# Patient Record
Sex: Female | Born: 2000 | Race: White | Hispanic: No | Marital: Single | Smoking: Never smoker
Health system: Southern US, Community
[De-identification: ages and names within clinical notes are randomized; demographics above are authoritative.]

## PROBLEM LIST (undated history)

## (undated) DIAGNOSIS — J45909 Unspecified asthma, uncomplicated: Secondary | ICD-10-CM

## (undated) DIAGNOSIS — F909 Attention-deficit hyperactivity disorder, unspecified type: Secondary | ICD-10-CM

## (undated) DIAGNOSIS — M109 Gout, unspecified: Secondary | ICD-10-CM

## (undated) HISTORY — DX: Unspecified asthma, uncomplicated: J45.909

## (undated) HISTORY — DX: Attention-deficit hyperactivity disorder, unspecified type: F90.9

---
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
## 2021-01-05 ENCOUNTER — Other Ambulatory Visit: Payer: Self-pay

## 2021-01-05 ENCOUNTER — Other Ambulatory Visit (INDEPENDENT_AMBULATORY_CARE_PROVIDER_SITE_OTHER): Payer: Medicaid Other | Admitting: Family Medicine

## 2021-01-05 ENCOUNTER — Encounter: Payer: Self-pay | Admitting: Family Medicine

## 2021-01-05 VITALS — BP 118/66 | HR 71 | Temp 98.0°F | Ht 64.26 in | Wt 252.0 lb

## 2021-01-05 DIAGNOSIS — H6502 Acute serous otitis media, left ear: Secondary | ICD-10-CM

## 2021-01-05 DIAGNOSIS — Z3042 Encounter for surveillance of injectable contraceptive: Secondary | ICD-10-CM | POA: Diagnosis not present

## 2021-01-05 MED ORDER — CLARITIN-D 12 HOUR 5-120 MG PO TB12: 1.0000 | ORAL_TABLET | Freq: Two times a day (BID) | ORAL | 1 refills | Status: DC

## 2021-01-05 MED ORDER — FLUTICASONE PROPIONATE 50 MCG/ACT NA SUSP: 2.0000 | Freq: Every day | NASAL | 1 refills | Status: DC

## 2021-01-05 MED ORDER — MEDROXYPROGESTERONE ACETATE 150 MG/ML IM SUSP
150.0000 mg | Freq: Once | INTRAMUSCULAR | Status: AC
  Administered 2021-01-05: 150 mg via INTRAMUSCULAR

## 2021-01-05 NOTE — Addendum Note (Signed)
Addended by: Erven Colla on: 01/05/2021 11:09 AM   Modules accepted: Orders, Level of Service

## 2021-01-05 NOTE — Progress Notes (Signed)
   Patient ID: Crystal Holloway, female    DOB: 08-08-2001, 20 y.o.   MRN: [id number]   Chief Complaint  Patient presents with  . left ear pain    X 3 days currently taking cough syrup per UC for about a week    Subjective:    HPI   Medical History Luevenia has a past medical history of ADHD (attention deficit hyperactivity disorder) and Asthma.   Outpatient Encounter Medications as of 01/05/2021  Medication Sig  . amoxicillin-clavulanate (AUGMENTIN) 875-125 MG tablet Take 1 tablet by mouth 2 (two) times daily.  . hydrocortisone 1 % ointment Apply 1 application topically 2 (two) times daily.  . montelukast (SINGULAIR) 10 MG tablet Take 1 tablet (10 mg total) by mouth at bedtime.  . [EXPIRED] medroxyPROGESTERone (DEPO-PROVERA) injection 150 mg    No facility-administered encounter medications on file as of 01/05/2021.     Review of Systems   Vitals There were no vitals taken for this visit.  Objective:   Physical Exam   Assessment and Plan   1. Surveillance for Depo-Provera contraception - medroxyPROGESTERone (DEPO-PROVERA) injection 150 mg      No follow-ups on file.   01/05/2021

## 2021-01-05 NOTE — Progress Notes (Addendum)
   Patient ID: Crystal Holloway, female    DOB: 2001/07/23, 20 y.o.   MRN: [id number]   Chief Complaint  Patient presents with  . left ear pain    X 3 days currently taking cough syrup per UC for about a week    Subjective:    HPI Pt having left ear pain.  1 wk ago went to urgent care for uri, dx with bronchitis and allergies.  Ear pain started 3 days ago. meds- gave a pill for cough and inhaler.   Treated 2/22- augmentin. For concern of dog bite.   Medical History Earnie has a past medical history of ADHD (attention deficit hyperactivity disorder) and Asthma.   Outpatient Encounter Medications as of 01/05/2021  Medication Sig  . amoxicillin-clavulanate (AUGMENTIN) 875-125 MG tablet Take 1 tablet by mouth 2 (two) times daily.  . hydrocortisone 1 % ointment Apply 1 application topically 2 (two) times daily.  . montelukast (SINGULAIR) 10 MG tablet Take 1 tablet (10 mg total) by mouth at bedtime.  . [EXPIRED] medroxyPROGESTERone (DEPO-PROVERA) injection 150 mg    No facility-administered encounter medications on file as of 01/05/2021.     Review of Systems  +Left ear pain,  Denies fever, chills, n/v/d.  Denies sore throat, sob, wheezing, eye symptoms. +mild coughing.     Vitals There were no vitals taken for this visit.  Objective:   Physical Exam  Left TM- mild erythema, with clear serous effusion.  Rt tm - normal.  Normal EAC bilaterally. Nose- clear drainage bilaterally. resp- non labored breathing. Eyes- normal conjunctive, no drainage.  Assessment and Plan   1. Surveillance for Depo-Provera contraception - medroxyPROGESTERone (DEPO-PROVERA) injection 150 mg   1. Non-recurrent acute serous otitis media of left ear  2. Surveillance for Depo-Provera contraception - medroxyPROGESTERone (DEPO-PROVERA) injection 150 mg  Gave flonase and claritin d for 10-14 days. Tylenol or ibuprofen prn for pain. Warm compress to ear/neck. If not better call or rto.      F/u prn.   01/05/2021

## 2021-01-05 NOTE — Patient Instructions (Signed)
Otitis Media With Effusion, Pediatric  Otitis media with effusion (OME) occurs when there is inflammation of the middle ear and fluid in the middle ear space. The middle ear space contains air and the bones for hearing. Air in the middle ear space helps to transmit sound to the brain. OME is a common condition in children, and it can occur after an ear infection. This condition may be present for several weeks or longer after an ear infection. Most cases of this condition get better on their own. What are the causes? OME is caused by a blockage of the eustachian tube in one or both ears. These tubes drain fluid in the ears to the back of the nose (nasopharynx). If the tissue in the tube swells up (edema), the tube closes. This prevents fluid from draining. Blockage can be caused by:  Ear infections.  Colds and other upper respiratory infections.  Enlarged adenoids. The adenoids are areas of soft tissue located high in the back of the throat, behind the nose and the roof of the mouth. They are part of the body's natural defense (immune) system.  A mass in the back of the nose (nasopharynx).  Damage to the ear caused by pressure changes (barotrauma). What increases the risk? Your child is more likely to develop this condition if he or she:  Has repeated ear and sinus infections.  Has allergies.  Is exposed to tobacco smoke.  Attends day care.  Was not breastfed. What are the signs or symptoms? Symptoms of this condition may not be obvious. Sometimes this condition does not have any symptoms, or symptoms may overlap with those of a cold or upper respiratory tract illness. Symptoms of this condition include:  Temporary hearing loss.  A feeling of fullness in the ear without pain.  Irritability or agitation.  Balance (vestibular) problems. As a result of hearing loss, your child may:  Listen to the TV at a loud volume.  Not respond to questions.  Ask "What?" often when spoken  to.  Mistake or confuse one sound or word for another.  Perform poorly at school.  Have a poor attention span.  Become agitated or irritated easily. How is this diagnosed? This condition is diagnosed with an ear exam. Your child's health care provider will look inside your child's ear with an instrument (otoscope) to check for redness, swelling, and fluid. Other tests may be done, including:  A test to check the movement of the eardrum (pneumatic otoscopy). This is done by squeezing a small amount of air into the ear.  A test that changes air pressure in the middle ear to check how well the eardrum moves and to see if the eustachian tube is working (tympanogram).  Hearing test (audiogram). This test involves playing tones at different pitches to see if your child can hear each tone.   How is this treated? Treatment for this condition depends on the cause. In many cases, the fluid goes away on its own. In some cases, your child may need a procedure to create a hole in the eardrum to allow fluid to drain (myringotomy) and to insert small drainage tubes (tympanostomy tubes) into the eardrums. These tubes help to drain fluid and prevent infection. This procedure may be recommended if:  OME does not get better over several months.  Your child has many ear infections within several months.  Your child has noticeable hearing loss.  Your child has problems with speech and language development. Surgery may also be   done to remove the adenoids (adenoidectomy) if it seems they are contributing to the condition. Follow these instructions at home:  Give over-the-counter and prescription medicines only as told by your child's health care provider.  Keep children away from any tobacco smoke.  Keep all follow-up visits as told by your child's health care provider. This is important. How is this prevented?  Keep your child's vaccinations up to date.  Encourage hand washing. Your child should  wash his or her hands often with soap and water. If there is no soap and water, he or she should use hand sanitizer.  Avoid exposing your child to tobacco smoke.  Give your baby breastmilk, if possible. Breastfed babies are less likely to develop this condition. Contact a health care provider if:  Your child's hearing does not get better after 3 months.  Your child's hearing is worse.  Your child has ear pain.  Your child has a fever.  Your child has drainage from the ear.  Your child is dizzy.  Your child has a lump on his or her neck. Get help right away if your child:  Has bleeding from the nose.  Cannot move part of his or her face.  Has trouble breathing.  Cannot smell.  Develops severe congestion.  Develops weakness.  Who is younger than 3 months has a temperature of 100.4F (38C) or higher. Summary  Otitis media with effusion (OME) occurs when there is inflammation of the middle ear and fluid in the middle ear space. This can occur following an ear infection.  Symptoms may include hearing loss, a feeling of fullness in the ear, increased irritability, and possible balance issues. Sometimes there are no symptoms.  This condition can be diagnosed with a physical exam and some additional testing.  Treatment depends on the cause. Observation may be recommended. This information is not intended to replace advice given to you by your health care provider. Make sure you discuss any questions you have with your health care provider. Document Revised: 07/30/2019 Document Reviewed: 07/30/2019 Elsevier Patient Education  2021 [employer] Inc.  

## 2021-01-10 ENCOUNTER — Ambulatory Visit: Payer: Medicaid Other | Admitting: Family Medicine

## 2021-01-18 ENCOUNTER — Ambulatory Visit: Payer: Medicaid Other | Admitting: Family Medicine

## 2021-02-08 ENCOUNTER — Other Ambulatory Visit: Payer: Self-pay

## 2021-02-08 ENCOUNTER — Ambulatory Visit (INDEPENDENT_AMBULATORY_CARE_PROVIDER_SITE_OTHER): Payer: Medicaid Other | Admitting: Family Medicine

## 2021-02-08 VITALS — BP 119/87 | HR 93 | Temp 98.1°F | Ht 64.25 in | Wt 248.2 lb

## 2021-02-08 DIAGNOSIS — R1032 Left lower quadrant pain: Secondary | ICD-10-CM

## 2021-02-08 NOTE — Progress Notes (Signed)
Patient ID: Crystal Holloway, female    DOB: [birth year], 20 y.o.   MRN: [id number]   Chief Complaint  Patient presents with   lower abdominal cramps    Since last pm   Subjective:    HPI Feeling like period cramps and feeling it's more on left side.  Mother and sister with ovarian cysts.  Not had periods recently and is on depo provera.   Was on bcp for 2-3 months.   Went back on depo to avoid having periods. Was 8/21- restarted the depo provera injection.  Cramping started last night. Not near her regular menstrual time.  Diarrhea- chronic happenings daily.  Pt thinking it was IBS. Hasn't seen GI doctors.  Took Midol today, didn't help.  No recent sexual intercourse.  No concerns of stds. No dysuria, hematuria, vaginal discharge, or pelvic pain. No h/o stds.  39moago ended a long 3 yr relationship/engagement. Dec eating and feeling nausea after eating, lost 10 lbs.   Medical History KAnastasiyahas a past medical history of ADHD (attention deficit hyperactivity disorder) and Asthma.   Outpatient Encounter Medications as of 02/08/2021  Medication Sig   amoxicillin-clavulanate (AUGMENTIN) 875-125 MG tablet Take 1 tablet by mouth 2 (two) times daily. (Patient not taking: Reported on 02/08/2021)   fluticasone (FLONASE) 50 MCG/ACT nasal spray Place 2 sprays into both nostrils daily.   hydrocortisone 1 % ointment Apply 1 application topically 2 (two) times daily.   loratadine-pseudoephedrine (CLARITIN-D 12 HOUR) 5-120 MG tablet Take 1 tablet by mouth 2 (two) times daily.   montelukast (SINGULAIR) 10 MG tablet Take 1 tablet (10 mg total) by mouth at bedtime.   No facility-administered encounter medications on file as of 02/08/2021.     Review of Systems  Constitutional: Negative for chills and fever.  HENT: Negative for congestion, rhinorrhea and sore throat.   Respiratory: Negative for cough, shortness of breath and wheezing.   Cardiovascular: Negative for chest pain and  leg swelling.  Gastrointestinal: Positive for abdominal pain (left lower quad). Negative for diarrhea, nausea and vomiting.  Genitourinary: Negative for difficulty urinating, dysuria, frequency, hematuria, menstrual problem, pelvic pain, vaginal bleeding, vaginal discharge and vaginal pain.  Musculoskeletal: Negative for arthralgias and back pain.  Skin: Negative for rash.  Neurological: Negative for dizziness, weakness and headaches.     Vitals BP 119/87   Pulse 93   Temp 98.1 F (36.7 C) (Oral)   Ht 5' 4.25" (1.632 m)   Wt 248 lb 3.2 oz (112.6 kg)   SpO2 99%   BMI 42.27 kg/m   Objective:   Physical Exam Vitals and nursing note reviewed.  Constitutional:      General: She is not in acute distress.    Appearance: Normal appearance. She is not ill-appearing.  HENT:     Head: Normocephalic and atraumatic.  Eyes:     Extraocular Movements: Extraocular movements intact.     Conjunctiva/sclera: Conjunctivae normal.     Pupils: Pupils are equal, round, and reactive to light.  Cardiovascular:     Rate and Rhythm: Normal rate and regular rhythm.     Pulses: Normal pulses.     Heart sounds: Normal heart sounds.  Pulmonary:     Effort: Pulmonary effort is normal.     Breath sounds: Normal breath sounds. No wheezing, rhonchi or rales.  Abdominal:     General: Bowel sounds are normal. There is no distension.     Palpations: Abdomen is soft. There is no mass.  Tenderness: There is abdominal tenderness (left lower quad). There is no guarding or rebound.     Hernia: No hernia is present.  Musculoskeletal:        General: Normal range of motion.     Right lower leg: No edema.     Left lower leg: No edema.  Skin:    General: Skin is warm and dry.     Findings: No lesion or rash.  Neurological:     General: No focal deficit present.     Mental Status: She is alert and oriented to person, place, and time.  Psychiatric:        Mood and Affect: Mood normal.        Behavior:  Behavior normal.     Assessment and Plan   1. Left lower quadrant pain - CBC With Differential - CMP14+EGFR - Lipase - US Pelvic Complete With Transvaginal; Future - US Pelvic Complete With Transvaginal    Pt having left lower quad pain, intermittent. Take midol or tyelnol prn.  If having severe pain needing to go to ER for urgent consult.  Labs and u/s to be done stat.  Will call pt with results.   Return if symptoms worsen or fail to improve.

## 2021-02-09 ENCOUNTER — Ambulatory Visit (HOSPITAL_COMMUNITY)
Admission: RE | Admit: 2021-02-09 | Discharge: 2021-02-09 | Disposition: A | Discharge status: Home / Self Care | Payer: Medicaid Other | Source: Ambulatory Visit | Attending: Family Medicine | Admitting: Family Medicine

## 2021-02-09 ENCOUNTER — Other Ambulatory Visit (HOSPITAL_COMMUNITY)
Admission: RE | Admit: 2021-02-09 | Discharge: 2021-02-09 | Disposition: A | Discharge status: Home / Self Care | Payer: Medicaid Other | Source: Ambulatory Visit | Attending: Family Medicine | Admitting: Family Medicine

## 2021-02-09 DIAGNOSIS — R1032 Left lower quadrant pain: Secondary | ICD-10-CM | POA: Diagnosis not present

## 2021-02-09 LAB — COMPREHENSIVE METABOLIC PANEL
ALT: 34 U/L (ref 0–44)
AST: 23 U/L (ref 15–41)
Albumin: 4.2 g/dL (ref 3.5–5.0)
Alkaline Phosphatase: 89 U/L (ref 38–126)
Anion gap: 6 (ref 5–15)
BUN: 7 mg/dL (ref 6–20)
CO2: 23 mmol/L (ref 22–32)
Calcium: 9 mg/dL (ref 8.9–10.3)
Chloride: 107 mmol/L (ref 98–111)
Creatinine, Ser: 0.58 mg/dL (ref 0.44–1.00)
GFR, Estimated: >60 mL/min (ref >60)
Glucose, Bld: 92 mg/dL (ref 70–99)
Potassium: 4 mmol/L (ref 3.5–5.1)
Sodium: 136 mmol/L (ref 135–145)
Total Bilirubin: 0.7 mg/dL (ref 0.3–1.2)
Total Protein: 7.5 g/dL (ref 6.5–8.1)

## 2021-02-09 LAB — CBC WITH DIFFERENTIAL/PLATELET
Abs Immature Granulocytes: 0.04 10*3/uL (ref 0.00–0.07)
Basophils Absolute: 0 10*3/uL (ref 0.0–0.1)
Basophils Relative: 1 %
Eosinophils Absolute: 0.1 10*3/uL (ref 0.0–0.5)
Eosinophils Relative: 2 %
HCT: 42.6 % (ref 36.0–46.0)
Hemoglobin: 14.4 g/dL (ref 12.0–15.0)
Immature Granulocytes: 1 %
Lymphocytes Relative: 32 %
Lymphs Abs: 2.9 10*3/uL (ref 0.7–4.0)
MCH: 30.3 pg (ref 26.0–34.0)
MCHC: 33.8 g/dL (ref 30.0–36.0)
MCV: 89.7 fL (ref 80.0–100.0)
Monocytes Absolute: 0.5 10*3/uL (ref 0.1–1.0)
Monocytes Relative: 6 %
Neutro Abs: 5.3 10*3/uL (ref 1.7–7.7)
Neutrophils Relative %: 58 %
Platelets: 456 10*3/uL — ABNORMAL HIGH (ref 150–400)
RBC: 4.75 MIL/uL (ref 3.87–5.11)
RDW: 12 % (ref 11.5–15.5)
WBC: 8.9 10*3/uL (ref 4.0–10.5)
nRBC: 0 % (ref 0.0–0.2)

## 2021-02-09 LAB — LIPASE, BLOOD: Lipase: 27 U/L (ref 11–51)

## 2021-03-29 ENCOUNTER — Other Ambulatory Visit: Payer: Medicaid Other

## 2021-08-30 ENCOUNTER — Other Ambulatory Visit: Payer: Self-pay

## 2021-08-30 ENCOUNTER — Encounter: Payer: Self-pay | Admitting: Nurse Practitioner

## 2021-08-30 ENCOUNTER — Ambulatory Visit (INDEPENDENT_AMBULATORY_CARE_PROVIDER_SITE_OTHER): Payer: Medicaid Other | Admitting: Nurse Practitioner

## 2021-08-30 VITALS — BP 128/84 | HR 80 | Temp 97.9°F | Ht 64.27 in | Wt 241.6 lb

## 2021-08-30 DIAGNOSIS — Z30013 Encounter for initial prescription of injectable contraceptive: Secondary | ICD-10-CM

## 2021-08-30 DIAGNOSIS — F419 Anxiety disorder, unspecified: Secondary | ICD-10-CM

## 2021-08-30 DIAGNOSIS — F32A Depression, unspecified: Secondary | ICD-10-CM

## 2021-08-30 MED ORDER — MEDROXYPROGESTERONE ACETATE 150 MG/ML IM SUSP: 150.0000 mg | Freq: Once | INTRAMUSCULAR | 0 refills | Status: DC

## 2021-08-30 MED ORDER — SERTRALINE HCL 25 MG PO TABS: 25.0000 mg | ORAL_TABLET | Freq: Every day | ORAL | 0 refills | Status: DC

## 2021-08-30 MED ORDER — FLUOXETINE HCL 10 MG PO CAPS
10.0000 mg | ORAL_CAPSULE | Freq: Every day | ORAL | 0 refills | Status: DC
Start: 2021-08-30 — End: 2021-09-12

## 2021-08-30 NOTE — Progress Notes (Signed)
° °  Subjective:    Patient ID: Crystal Holloway, female    DOB: 08-27-2001, 20 y.o.   MRN: [id number]  HPI  Patient reports to clinic with desire to restart depo. Patient's last depo shot was June/July 2022. Patient started back having her periods in November and her menstrual cycle has been on and off since then. Patient described flow as spotting. Denies any heavy flows. Patient states period stopped about 4 days ago. Patient states that she has had both STI testing and a pregnancy test done around 11/28 which both resulted as negative. Patient took home pregnancy test this week negative. Patient states she missed not having periods.  Patient also would like to discuss depression. Patient states that her fiance committed suicide about 6 months ago and she has felt very sad. She mentions that she tends to keep her emotions to herself however, it is getting hard. She has since reunited with her family after leaving them for her fiance which she states is very hard. Patient mentions that she needs help with her emotions. Patient denies wanting to hurt herself or anyone else.     Review of Systems  Constitutional:  Negative for chills and fever.  Psychiatric/Behavioral:  Positive for agitation, dysphoric mood and sleep disturbance. Negative for behavioral problems, confusion, decreased concentration and suicidal ideas.       Objective:   Physical Exam Constitutional:      General: She is not in acute distress.    Appearance: Normal appearance. She is obese. She is not ill-appearing or toxic-appearing.  HENT:     Head: Normocephalic.  Cardiovascular:     Rate and Rhythm: Normal rate and regular rhythm.     Pulses: Normal pulses.     Heart sounds: Normal heart sounds. No murmur heard. Pulmonary:     Effort: Pulmonary effort is normal. No respiratory distress.     Breath sounds: No wheezing.  Musculoskeletal:        General: Normal range of motion.  Skin:    General: Skin is warm.   Neurological:     General: No focal deficit present.     Mental Status: She is alert and oriented to person, place, and time.  Psychiatric:        Mood and Affect: Mood normal.        Behavior: Behavior normal.     Comments: Tearful at times          Assessment & Plan:  1. Anxiety and depression - Start Prozac 10mg  daily. RTC in 2 weeks and will increase to 20mg  if tolerating well. - Side effect profile reviewed.  - Offered counseling, patient states that she had done counseling in the past but it has not helped. - Highly encouraged patient listen to audiobook, Feeling Good by Dr. Judie Petit. - Discussed benefits of CBT  - Discussed benefit of both CBT and pharmacologic therapy simultaneously - PHQ-9 = 12 today. - Patient denies thoughts of hurting self or anyone else.   2. Encounter for initial prescription of injectable contraceptive - Depo-Provera sent to pharmacy for patient pick up and bring to clinic in 2 weeks. - Conduct UPT prior to administering contraceptive

## 2021-08-30 NOTE — Patient Instructions (Signed)
° °  High suggest to read this book and we can discuss possible options for counseling in the future

## 2021-09-12 ENCOUNTER — Encounter: Payer: Self-pay | Admitting: Nurse Practitioner

## 2021-09-12 ENCOUNTER — Other Ambulatory Visit: Payer: Self-pay

## 2021-09-12 ENCOUNTER — Ambulatory Visit (INDEPENDENT_AMBULATORY_CARE_PROVIDER_SITE_OTHER): Payer: Medicaid Other | Admitting: Nurse Practitioner

## 2021-09-12 VITALS — BP 122/78 | HR 91 | Ht 64.0 in | Wt 241.0 lb

## 2021-09-12 DIAGNOSIS — Z30013 Encounter for initial prescription of injectable contraceptive: Secondary | ICD-10-CM

## 2021-09-12 DIAGNOSIS — F419 Anxiety disorder, unspecified: Secondary | ICD-10-CM

## 2021-09-12 DIAGNOSIS — F32A Depression, unspecified: Secondary | ICD-10-CM

## 2021-09-12 DIAGNOSIS — Z0000 Encounter for general adult medical examination without abnormal findings: Secondary | ICD-10-CM

## 2021-09-12 DIAGNOSIS — Z0001 Encounter for general adult medical examination with abnormal findings: Secondary | ICD-10-CM | POA: Diagnosis not present

## 2021-09-12 LAB — POCT URINE PREGNANCY: Preg Test, Ur: NEGATIVE

## 2021-09-12 MED ORDER — FLUOXETINE HCL 20 MG PO CAPS: 20.0000 mg | ORAL_CAPSULE | Freq: Every day | ORAL | 0 refills | Status: DC

## 2021-09-12 MED ORDER — FLUOXETINE HCL 20 MG PO CAPS
20.0000 mg | ORAL_CAPSULE | Freq: Every day | ORAL | 0 refills | Status: DC
Start: 2021-09-12 — End: 2022-05-30

## 2021-09-12 MED ORDER — MEDROXYPROGESTERONE ACETATE 150 MG/ML IM SUSP
150.0000 mg | Freq: Once | INTRAMUSCULAR | Status: AC
  Administered 2021-09-12: 150 mg via INTRAMUSCULAR

## 2021-09-12 NOTE — Progress Notes (Signed)
° °  Subjective:    Patient ID: Crystal Holloway, female    DOB: 2000-10-11, 21 y.o.   MRN: [id number]  HPI  The patient comes in today for a wellness visit.  A review of their health history was completed.  A review of medications was also completed.  Any needed refills: Depo Provera: Would like to restart depo provera. LMP 08/19/21 for 7 days. No intercourse since then. Pregnancy test negative today.   Eating habits: trying to eat better- no appetite lately. Self reports hx of IBS that has been better since she has started Prozac.   Falls/  MVA accidents in past few months: none  Regular exercise: just at work  Specialist pt sees on regular basis: no  Preventative health issues were discussed.  Due for flu vax Due for COVID vax HPV vax  Additional concerns:  Anxiety/Depression Patient states that she has been doing well on Prozac 10mg . Patient states that her mood has improved greatly since being on the medication but she does notice that she begins to get sad around 5pm. However, patient states she is much happier now. Patient also states that her IBS symptoms have lessened since being on Prozac. Patient has not had a chance to read Dr. Eilleen Kempf "Feeling Good" but has started reading "Unfu*k Yourself" by Virl Diamond which has helped a lot.   Intentional weight loss Patient states that she has lost 10-15lbs since November and can now wear a size 14. She states she is really proud of herself and is motivated to continue to loose more weight.   Results for orders placed or performed in visit on 09/12/21  POCT urine pregnancy  Result Value Ref Range   Preg Test, Ur Negative Negative       Review of Systems All symptoms negative    Objective:   Physical Exam Constitutional:      Appearance: Normal appearance. She is obese.  Neurological:     Mental Status: She is alert.          Assessment & Plan:   1. Encounter for initial prescription of injectable  contraceptive - Depo given today. - POCT urine pregnancy= negative today - RTC in 3 months of next depo shot  2. Wellness examination - Adolescent wellness-complete.wellness physical was conducted today. Importance of diet and exercise were discussed in detail.  In addition to this a discussion regarding safety was also covered. We also reviewed over immunizations and gave recommendations regarding current immunization needed for age.  In addition to this additional areas were also touched on including: - Preventative health exams needed:  - Patient declined COVID vaccine today - Patient declined Flu vaccine today - Discussed HPV vax. Patient unsure if she received it previously or not. Will discuss with mother. If not, will re-address in 3 months.  -Patient was advised yearly wellness exam  - Discussed vaping and dangers of vaping.  - HIV antibody (with reflex) - Hepatitis C Antibody - CBC with Differential - Lipid Panel With LDL/HDL Ratio  3. Anxiety and depression - Patient doing well on Prozac 10mg  and would like to increase to Prozac 20mg . - FLUoxetine (PROZAC) 20 MG capsule; Take 1 capsule (20 mg total) by mouth daily.  Dispense: 90 capsule; Refill: 0 - Continue to search out counseling options - RTC in 3 months - PHQ-9 = 4 today - GAD 7 = 13 today - No thoughts of hurting self or anyone else.

## 2021-09-13 LAB — HIV ANTIBODY (ROUTINE TESTING W REFLEX): HIV Screen 4th Generation wRfx: NONREACTIVE

## 2021-09-13 LAB — CBC WITH DIFFERENTIAL/PLATELET
Basophils Absolute: 0.1 10*3/uL (ref 0.0–0.2)
Basos: 1 %
EOS (ABSOLUTE): 0.1 10*3/uL (ref 0.0–0.4)
Eos: 1 %
Hematocrit: 44.6 % (ref 34.0–46.6)
Hemoglobin: 15.2 g/dL (ref 11.1–15.9)
Immature Grans (Abs): 0.1 10*3/uL (ref 0.0–0.1)
Immature Granulocytes: 1 %
Lymphocytes Absolute: 3.2 10*3/uL — ABNORMAL HIGH (ref 0.7–3.1)
Lymphs: 29 %
MCH: 31.5 pg (ref 26.6–33.0)
MCHC: 34.1 g/dL (ref 31.5–35.7)
MCV: 92 fL (ref 79–97)
Monocytes Absolute: 0.6 10*3/uL (ref 0.1–0.9)
Monocytes: 6 %
Neutrophils Absolute: 6.8 10*3/uL (ref 1.4–7.0)
Neutrophils: 62 %
Platelets: 471 10*3/uL — ABNORMAL HIGH (ref 150–450)
RBC: 4.83 x10E6/uL (ref 3.77–5.28)
RDW: 13.1 % (ref 11.7–15.4)
WBC: 10.8 10*3/uL (ref 3.4–10.8)

## 2021-09-13 LAB — LIPID PANEL WITH LDL/HDL RATIO
Cholesterol, Total: 154 mg/dL (ref 100–169)
HDL: 41 mg/dL (ref >39)
LDL Chol Calc (NIH): 83 mg/dL (ref 0–109)
LDL/HDL Ratio: 2 ratio (ref 0.0–3.2)
Triglycerides: 172 mg/dL — ABNORMAL HIGH (ref 0–89)
VLDL Cholesterol Cal: 30 mg/dL (ref 5–40)

## 2021-09-13 LAB — HEPATITIS C ANTIBODY: Hep C Virus Ab: <0.1 s/co ratio (ref 0.0–0.9)

## 2021-09-14 NOTE — Progress Notes (Signed)
Please call the patient with the following message:  Your labs looked good. HIV negative Blood count looks good Cholesterol looks good with execption of triglycerides. It is moderately elevated. You can change that by diet and exercise. Eat a well balanced meal with plenty vegetables and fiber. Exercise for ~30 minutes a day for 3-5x a week.   I hope all is well. Let us know if you need anything.

## 2021-11-29 ENCOUNTER — Encounter: Payer: Self-pay | Admitting: Nurse Practitioner

## 2021-11-29 ENCOUNTER — Ambulatory Visit: Payer: Medicaid Other | Admitting: Nurse Practitioner

## 2022-05-30 ENCOUNTER — Ambulatory Visit (INDEPENDENT_AMBULATORY_CARE_PROVIDER_SITE_OTHER): Payer: Medicaid Other | Admitting: Family Medicine

## 2022-05-30 DIAGNOSIS — L989 Disorder of the skin and subcutaneous tissue, unspecified: Secondary | ICD-10-CM

## 2022-05-30 MED ORDER — IMIQUIMOD 5 % EX CREA: TOPICAL_CREAM | Freq: Every day | CUTANEOUS | 0 refills | Status: DC

## 2022-05-30 NOTE — Patient Instructions (Signed)
Medication as prescribed.  If you continue to have difficulty, please let me know.

## 2022-05-30 NOTE — Progress Notes (Signed)
Subjective:  Patient ID: Crystal Holloway, female    DOB: [birth year]  Age: 21 y.o. MRN: [id number]  CC: Chief Complaint  Patient presents with   small gorwth on right hand index finger    She tried cutting some of it off 1 month ago and it grew back 2 days later    HPI:  21 year old female presents for evaluation of the above.   This area has been present for > 6 months per report. Was accidentally "cut off" 1 month ago and has return. It is raised and located on the right index finger. No currently redness or pain. No drainage. No relieving factors.   Patient Active Problem List   Diagnosis Date Noted   Lesion of finger 05/30/2022   Birth control counseling 04/28/2020   Atypical mole 04/28/2020   Irritable bowel syndrome with both constipation and diarrhea 04/08/2019   Acne vulgaris 06/26/2017   Dysmenorrhea 06/26/2017   Anxiety and depression 11/24/2016   Self-mutilation 11/24/2016   Gastroesophageal reflux disease without esophagitis 08/09/2016   ADD (attention deficit disorder) 10/07/2013    Social Hx   Social History   Socioeconomic History   Marital status: Single    Spouse name: Not on file   Number of children: Not on file   Years of education: Not on file   Highest education level: Not on file  Occupational History   Not on file  Tobacco Use   Smoking status: Never   Smokeless tobacco: Never  Substance and Sexual Activity   Alcohol use: No   Drug use: No   Sexual activity: Not on file  Other Topics Concern   Not on file  Social History Narrative   Not on file   Social Determinants of Health   Financial Resource Strain: Not on file  Food Insecurity: Not on file  Transportation Needs: Not on file  Physical Activity: Not on file  Stress: Not on file  Social Connections: Not on file    Review of Systems Per HPI  Objective:  BP 112/76   Pulse 76   Temp 98.2 F (36.8 C)   Ht '5\' 4"'$  (1.626 m)   Wt 254 lb (115.2 kg)   BMI 43.60 kg/m       05/30/2022    9:22 AM 09/12/2021    2:17 PM 08/30/2021    3:30 PM  BP/Weight  Systolic BP 222 979 892  Diastolic BP 76 78 84  Wt. (Lbs) 254 241   BMI 43.6 kg/m2 41.37 kg/m2     Physical Exam Vitals and nursing note reviewed.  Constitutional:      General: She is not in acute distress.    Appearance: Normal appearance.  HENT:     Head: Normocephalic and atraumatic.  Pulmonary:     Effort: Pulmonary effort is normal. No respiratory distress.  Skin:    Comments: Index finger, right hand - small, raised lesion noted. No erythema. Non tender.   Neurological:     Mental Status: She is alert.  Psychiatric:        Mood and Affect: Mood normal.        Behavior: Behavior normal.    Lab Results  Component Value Date   WBC 10.8 09/12/2021   HGB 15.2 09/12/2021   HCT 44.6 09/12/2021   PLT 471 (H) 09/12/2021   GLUCOSE 92 02/09/2021   CHOL 154 09/12/2021   TRIG 172 (H) 09/12/2021   HDL 41 09/12/2021   LDLCALC 83 09/12/2021  ALT 34 02/09/2021   AST 23 02/09/2021   NA 136 02/09/2021   K 4.0 02/09/2021   CL 107 02/09/2021   CREATININE 0.58 02/09/2021   BUN 7 02/09/2021   CO2 23 02/09/2021     Assessment & Plan:   Problem List Items Addressed This Visit       Other   Lesion of finger    Wart vs cystic lesion. Trial of Imiquimod.       Meds ordered this encounter  Medications   imiquimod (ALDARA) 5 % cream    Sig: Apply topically at bedtime. Max use - 12 weeks.    Dispense:  12 each    Refill:  [place]

## 2022-05-30 NOTE — Assessment & Plan Note (Signed)
Wart vs cystic lesion. Trial of Imiquimod.

## 2022-09-17 ENCOUNTER — Telehealth: Payer: Self-pay | Admitting: *Deleted

## 2022-09-17 NOTE — Telephone Encounter (Signed)
Transition Care Management Follow-up Telephone Call Date of discharge and from where: 09/16/2022 [facility] ER How have you been since you were released from the hospital? Not doing any better  Any questions or concerns? No  Items Reviewed: Did the pt receive and understand the discharge instructions provided? Yes  Medications obtained and verified? Yes  Other? No  Any new allergies since your discharge? No  Dietary orders reviewed? Yes Do you have support at home? Yes   Functional Questionnaire: (I = Independent and D = Dependent) ADLs: i  Bathing/Dressing- i  Meal Prep- i  Eating- i  Maintaining continence- i  Transferring/Ambulation- i  Managing Meds- i  Follow up appointments reviewed:  PCP Hospital f/u appt confirmed?  Will call the PCP office    Are transportation arrangements needed? No  If their condition worsens, is the pt aware to call PCP or go to the Emergency Dept.? Yes Was the patient provided with contact information for the PCP's office or ED? Yes Was to pt encouraged to call back with questions or concerns? Yes

## 2022-09-18 ENCOUNTER — Encounter: Payer: Self-pay | Admitting: Family Medicine

## 2022-09-18 ENCOUNTER — Ambulatory Visit: Payer: Medicaid Other | Admitting: Family Medicine

## 2022-09-18 VITALS — BP 101/68 | Wt 264.0 lb

## 2022-09-18 DIAGNOSIS — S39012D Strain of muscle, fascia and tendon of lower back, subsequent encounter: Secondary | ICD-10-CM | POA: Diagnosis not present

## 2022-09-18 NOTE — Progress Notes (Signed)
   Subjective:    Patient ID: Crystal Holloway, female    DOB: 03/31/2001, 22 y.o.   MRN: [id number]  HPI Pt arrives for ER follow up. Pt went to [facility] ER on 09/16/22 for back strain. ER diagnosed with torn muscle. 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 Pain in mid to lower back. Taking Naproxen and Flexeril   Patient was seen in the ER she relates that this occurred after lifting a client and felt a pop and a pull in her back has had increased pain discomfort ever since Review of Systems     Objective:   Physical Exam  Subjective tenderness in the lower back negative straight leg raise increased pain with rotation      Assessment & Plan:  Back pain Muscle strain Patient requests Velcro brace Avoid lifting over 20 pounds over the course of the next week Then should be able to lift 25 to 30 pounds and after that get back to normal Recommend physical therapy Referral to physical therapy in [facility] Follow-up within 4 to 6 weeks

## 2022-10-09 ENCOUNTER — Telehealth: Payer: Self-pay

## 2022-10-09 NOTE — Telephone Encounter (Signed)
Transition Care Management Unsuccessful Follow-up Telephone Call  Date of discharge and from where:  [facility] 10/08/2022  Attempts:  1st Attempt  Reason for unsuccessful TCM follow-up call:  Left voice message Juanda Crumble, [place] Direct Dial [phone]

## 2022-10-10 NOTE — Telephone Encounter (Signed)
Transition Care Management Follow-up Telephone Call Date of discharge and from where: [facility] ED 10/09/2022 How have you been since you were released from the hospital? Ears still stopped up Any questions or concerns? No  Items Reviewed: Did the pt receive and understand the discharge instructions provided? Yes  Medications obtained and verified? Yes  Other? No  Any new allergies since your discharge? No  Dietary orders reviewed? Yes Do you have support at home? Yes   Home Care and Equipment/Supplies: Were home health services ordered? no If so, what is the name of the agency? N/a  Has the agency set up a time to come to the patient's home? no Were any new equipment or medical supplies ordered?  No What is the name of the medical supply agency? N/a Were you able to get the supplies/equipment? no Do you have any questions related to the use of the equipment or supplies? No  Functional Questionnaire: (I = Independent and D = Dependent) ADLs: I  Bathing/Dressing- I  Meal Prep- I  Eating- I  Maintaining continence- I  Transferring/Ambulation- I  Managing Meds- I  Follow up appointments reviewed:  PCP Hospital f/u appt confirmed? Yes  Scheduled to see Dr Wolfgang Phoenix on 10/12/2022 @ 11:40. [facility] f/u appt confirmed? No   Are transportation arrangements needed? No  If their condition worsens, is the pt aware to call PCP or go to the Emergency Dept.? Yes Was the patient provided with contact information for the PCP's office or ED? Yes Was to pt encouraged to call back with questions or concerns? Yes .lh

## 2022-10-12 ENCOUNTER — Encounter: Payer: Self-pay | Admitting: Family Medicine

## 2022-10-12 ENCOUNTER — Ambulatory Visit: Payer: Medicaid Other | Admitting: Family Medicine

## 2022-10-12 VITALS — BP 122/78 | Wt 258.6 lb

## 2022-10-12 DIAGNOSIS — H65111 Acute and subacute allergic otitis media (mucoid) (sanguinous) (serous), right ear: Secondary | ICD-10-CM | POA: Diagnosis not present

## 2022-10-12 MED ORDER — AMOXICILLIN 500 MG PO CAPS: 500.0000 mg | ORAL_CAPSULE | Freq: Three times a day (TID) | ORAL | 0 refills | Status: AC

## 2022-10-12 NOTE — Progress Notes (Unsigned)
   Subjective:    Patient ID: Crystal Holloway, female    DOB: 06-20-2001, 22 y.o.   MRN: [id number]  HPI Pt arrives to follow up on double ear infection. Pt went to Urgent Care on 10/07/22. Pt states she is still having pressure in right ear. Pt reports ears feel clogged.  Relates slight head congestion drainage ear pressure pain discomfort denies any other trouble  Review of Systems     Objective:   Physical Exam Both ears appear to be recovering from an infection but the right one still has redness with some fluid but not angry looking left ear drum actually looking improved HEENT exam otherwise benign lungs clear       Assessment & Plan:  Persistent otitis 1 additional week of antibiotics call back if progressive troubles or worse Augmentin causing stomach distress therefore switch to amoxicillin notify us if any problems follow-up if not doing better within 7 to 10 days

## 2022-10-30 ENCOUNTER — Encounter: Payer: Self-pay | Admitting: Family Medicine

## 2022-10-30 ENCOUNTER — Ambulatory Visit: Payer: Medicaid Other | Admitting: Family Medicine

## 2022-10-30 VITALS — BP 100/70 | Wt 262.6 lb

## 2022-10-30 DIAGNOSIS — M2550 Pain in unspecified joint: Secondary | ICD-10-CM

## 2022-10-30 DIAGNOSIS — R400 Somnolence: Secondary | ICD-10-CM | POA: Diagnosis not present

## 2022-10-30 NOTE — Progress Notes (Signed)
   Subjective:    Patient ID: Crystal Holloway, female    DOB: [birth year], 22 y.o.   MRN: [id number]  HPI Pt arrives for follow up on back pain. Pt states pain has greatly improved. Can feel radiating pain in back if she is up and then sits down. Patient relates she is going to the gym or doing exercise and her back is starting to feel better She relates she has a lot of hand stiffness shoulder stiffness hip stiffness knee stiffness is worse in the morning hours.  She has a strong family history of rheumatoid arthritis she is concerned about the possibility of arthritis Would like to be checked for RA-pt states has joint pain everywhere and father has RA.  Discuss sleep apnea. Pt states she feels tired upon awakening.  She has a lot snoring at nighttime frequent awakening during the night plus also when she gets up in the morning she feels fatigued and tired sleepy throughout the day positive [place] questionnaire  Review of Systems     Objective:   Physical Exam General-in no acute distress Eyes-no discharge Lungs-respiratory rate normal, CTA CV-no murmurs,RRR Extremities skin warm dry no edema Neuro grossly normal Behavior normal, alert        Assessment & Plan:  Back pain is improving she is going to the gym doing some stretches  Arthralgias stiffness has family history of rheumatoid arthritis she states it is worse in the morning she would like to have blood work to check for this  Daytime somnolence-recommend sleep study Patient does snore a lot has a lot of fatigue and tiredness patient was cautioned never to drive a car if feeling sleepy or feeling like she is going to fall asleep while driving  Morbid obesity patient is working hard at [employer] regular activity and trying to lose weight

## 2022-10-30 NOTE — Patient Instructions (Addendum)
[place]

## 2022-10-31 LAB — C-REACTIVE PROTEIN: CRP: 4 mg/L (ref 0–10)

## 2022-10-31 LAB — RHEUMATOID FACTOR: Rheumatoid fact SerPl-aCnc: <10 IU/mL (ref <14.0)

## 2022-10-31 LAB — SEDIMENTATION RATE: Sed Rate: 10 mm/hr (ref 0–32)

## 2022-11-26 ENCOUNTER — Encounter: Payer: Self-pay | Admitting: Neurology

## 2022-11-26 ENCOUNTER — Ambulatory Visit: Payer: Medicaid Other | Admitting: Neurology

## 2022-11-26 VITALS — BP 127/77 | HR 95 | Ht 65.0 in | Wt 262.0 lb

## 2022-11-26 DIAGNOSIS — Z820 Family history of epilepsy and other diseases of the nervous system: Secondary | ICD-10-CM

## 2022-11-26 DIAGNOSIS — R0683 Snoring: Secondary | ICD-10-CM | POA: Diagnosis not present

## 2022-11-26 DIAGNOSIS — R0681 Apnea, not elsewhere classified: Secondary | ICD-10-CM | POA: Diagnosis not present

## 2022-11-26 DIAGNOSIS — G4719 Other hypersomnia: Secondary | ICD-10-CM | POA: Diagnosis not present

## 2022-11-26 DIAGNOSIS — R351 Nocturia: Secondary | ICD-10-CM

## 2022-11-26 DIAGNOSIS — R519 Headache, unspecified: Secondary | ICD-10-CM

## 2022-11-26 NOTE — Patient Instructions (Signed)

## 2022-11-26 NOTE — Progress Notes (Signed)
Subjective:    Patient ID: Crystal Holloway is a 22 y.o. female.  HPI    Star Age, MD, PhD [facility] [address].O. [place],  [address]  Dear Dr. Wolfgang Phoenix,  I saw your patient, [name], upon your kind request in my sleep clinic today for initial consultation of her sleep disorder, in particular, concern for underlying obstructive sleep apnea.  The patient is unaccompanied today.  As you know, Ms. Dockett is a 22 year old female with an underlying medical history of asthma, ADHD, arthralgia, and severe obesity with a BMI of over 40, who reports snoring and excessive daytime somnolence, as well as witnessed apneas per family.  Her sleepiness has become worse over the past few months.  Her Epworth sleepiness score is 19 out of 24, fatigue severity score is 55 out of 63.  I reviewed your office note from 10/30/2022.  She has woken up with a sense of gasping for air.  Her maternal grandmother had sleep apnea and had a CPAP machine.  Patient suspects that her mom also may have it.  She lives with her parents, her sister, brother-in-law and sister's child.  She does have a TV in her bedroom but it is on a sound program such as rain sounds at night.  She goes to bed generally somewhere between 8:30 PM and 11 PM and rise time is between 6 and 9 AM.  She works at a group home.  She has a variable work schedule.  She does not smoke cigarettes but vapes nicotine.  She is working on nicotine cessation.  She is working on weight loss as well.  She is currently not on any ADD or ADHD medication, reports weight gain on a stimulant.  She has nocturia about twice per average night.  She has occasionally woken up with a headache which is often dull and achy, bifrontal, she takes Tylenol as needed for this.  They have 3 dogs in the household, none of them sleep in her bedroom.  Her Past Medical History Is Significant For: Past Medical History:  Diagnosis Date    ADHD (attention deficit hyperactivity disorder)    Asthma     Her Past Surgical History Is Significant For: History reviewed. No pertinent surgical history.  Her Family History Is Significant For: Family History  Problem Relation Age of Onset   Sleep apnea Mother     Her Social History Is Significant For: Social History   Socioeconomic History   Marital status: Single    Spouse name: Not on file   Number of children: Not on file   Years of education: Not on file   Highest education level: Not on file  Occupational History   Not on file  Tobacco Use   Smoking status: Never   Smokeless tobacco: Never  Vaping Use   Vaping Use: Every day  Substance and Sexual Activity   Alcohol use: No   Drug use: No   Sexual activity: Not on file  Other Topics Concern   Not on file  Social History Narrative   Not on file   Social Determinants of Health   Financial Resource Strain: Not on file  Food Insecurity: Not on file  Transportation Needs: Not on file  Physical Activity: Not on file  Stress: Not on file  Social Connections: Not on file    Her Allergies Are:  Allergies  Allergen Reactions   Ibuprofen Hives and Swelling    Pt reports  being able to take aleve without difficulty  :   Her Current Medications Are:  Outpatient Encounter Medications as of 11/26/2022  Medication Sig   ELURYNG 0.12-0.015 MG/24HR vaginal ring Place vaginally.   No facility-administered encounter medications on file as of 11/26/2022.  :   Review of Systems:  Out of a complete 14 point review of systems, all are reviewed and negative with the exception of these symptoms as listed below:   Review of Systems  Neurological:        Pt here for sleep consult  Pt snores,headaches,fatigue  Pt denies hypertension,sleep study ,CPAP machine    ESS:19 FSS:55    Objective:  Neurological Exam  Physical Exam Physical Examination:   Vitals:   11/26/22 1402  BP: 127/77  Pulse: 95    General  Examination: The patient is a very pleasant 22 y.o. female in no acute distress. She appears well-developed and well-nourished and well groomed.   HEENT: Normocephalic, atraumatic, pupils are equal, round and reactive to light, extraocular tracking is good without limitation to gaze excursion or nystagmus noted. Hearing is grossly intact. Face is symmetric with normal facial animation. Speech is clear with no dysarthria noted. There is no hypophonia. There is no lip, neck/head, jaw or voice tremor. Neck is supple with full range of passive and active motion. There are no carotid bruits on auscultation. Oropharynx exam reveals: mild mouth dryness, adequate dental hygiene and moderate airway crowding, due to small airway entry, Mallampati class III, tonsils 1-2+ bilaterally.  Neck circumference 14 7/8 inches.  She has a minimal overbite.  Tongue protrudes centrally and palate elevates symmetrically.  Chest: Clear to auscultation without wheezing, rhonchi or crackles noted.  Heart: S1+S2+0, regular and normal without murmurs, rubs or gallops noted.   Abdomen: Soft, non-tender and non-distended.  Extremities: There is no pitting edema in the distal lower extremities bilaterally.   Skin: Warm and dry without trophic changes noted.   Musculoskeletal: exam reveals no obvious joint deformities.   Neurologically:  Mental status: The patient is awake, alert and oriented in all 4 spheres. Her immediate and remote memory, attention, language skills and fund of knowledge are appropriate. There is no evidence of aphasia, agnosia, apraxia or anomia. Speech is clear with normal prosody and enunciation. Thought process is linear. Mood is normal and affect is normal.  Cranial nerves II - XII are as described above under HEENT exam.  Motor exam: Normal bulk, strength and tone is noted. There is no obvious action or resting tremor.  Fine motor skills and coordination: grossly intact.  Cerebellar testing: No  dysmetria or intention tremor. There is no truncal or gait ataxia.  Sensory exam: intact to light touch in the upper and lower extremities.  Gait, station and balance: She stands easily. No veering to one side is noted. No leaning to one side is noted. Posture is age-appropriate and stance is narrow based. Gait shows normal stride length and normal pace. No problems turning are noted.   Assessment and Plan:  In summary, Rana Hochstein is a very pleasant 22 y.o.-year old female with an underlying medical history of asthma, ADHD, arthralgia, and severe obesity with a BMI of over 40, whose history and physical exam are concerning for sleep disordered breathing, particularly obstructive sleep apnea (OSA). A laboratory attended sleep study is typically considered "gold standard" for evaluation of sleep disordered breathing.   I had a long chat with the patient about my findings and the diagnosis of sleep apnea,  particularly OSA, its prognosis and treatment options. We talked about medical/conservative treatments, surgical interventions and non-pharmacological approaches for symptom control. I explained, in particular, the risks and ramifications of untreated moderate to severe OSA, especially with respect to developing cardiovascular disease down the road, including congestive heart failure (CHF), difficult to treat hypertension, cardiac arrhythmias (particularly A-fib), neurovascular complications including TIA, stroke and dementia. Even type 2 diabetes has, in part, been linked to untreated OSA. Symptoms of untreated OSA may include (but may not be limited to) daytime sleepiness, nocturia (i.e. frequent nighttime urination), memory problems, mood irritability and suboptimally controlled or worsening mood disorder such as depression and/or anxiety, lack of energy, lack of motivation, physical discomfort, as well as recurrent headaches, especially morning or nocturnal headaches. We talked about the importance of  maintaining a healthy lifestyle and striving for healthy weight.  The importance of complete nicotine cessation was also addressed.  In addition, we talked about the importance of striving for and maintaining good sleep hygiene. I recommended a sleep study at this time. I outlined the differences between a laboratory attended sleep study which is considered more comprehensive and accurate over the option of a home sleep test (HST); the latter may lead to underestimation of sleep disordered breathing in some instances and does not help with diagnosing upper airway resistance syndrome and is not accurate enough to diagnose primary central sleep apnea typically. I outlined possible surgical and non-surgical treatment options of OSA, including the use of a positive airway pressure (PAP) device (i.e. CPAP, AutoPAP/APAP or BiPAP in certain circumstances), a custom-made dental device (aka oral appliance, which would require a referral to a specialist dentist or orthodontist typically, and is generally speaking not considered for patients with full dentures or edentulous state), upper airway surgical options, such as traditional UPPP (which is not considered a first-line treatment) or the Inspire device (hypoglossal nerve stimulator, which would involve a referral for consultation with an ENT surgeon, after careful selection, following inclusion criteria - also not first-line treatment). I explained the PAP treatment option to the patient in detail, as this is generally considered first-line treatment.  The patient indicated that she would be willing to try PAP therapy, if the need arises. I explained the importance of being compliant with PAP treatment, not only for insurance purposes but primarily to improve patient's symptoms symptoms, and for the patient's long term health benefit, including to reduce Her cardiovascular risks longer-term.    We will pick up our discussion about the next steps and treatment options  after testing.  We will keep her posted as to the test results by phone call and/or MyChart messaging where possible.  We will plan to follow-up in sleep clinic accordingly as well.  I answered all her questions today and the patient was in agreement.   I encouraged her to call with any interim questions, concerns, problems or updates or email [place] through [place].  Generally speaking, sleep test authorizations may take up to 2 weeks, sometimes less, sometimes longer, the patient is encouraged to get in touch with [place] if they do not hear back from the sleep lab staff directly within the next 2 weeks.  Thank you very much for allowing me to participate in the care of this nice patient. If I can be of any further assistance to you please do not hesitate to call me at [phone].  Sincerely,   Star Age, MD, PhD

## 2022-12-04 ENCOUNTER — Telehealth: Payer: Self-pay

## 2022-12-04 NOTE — Telephone Encounter (Signed)
Vaccine record printed and up front for pick up 

## 2022-12-04 NOTE — Telephone Encounter (Signed)
Pt need copy of immunization record for new job tomorrow.   [place] [phone]

## 2022-12-10 ENCOUNTER — Telehealth: Payer: Self-pay | Admitting: Neurology

## 2022-12-10 NOTE — Telephone Encounter (Signed)
Sent mychart message

## 2022-12-11 NOTE — Telephone Encounter (Signed)
Can you offer her a HST with payment plan? Please try.

## 2022-12-12 IMAGING — US US PELVIS COMPLETE WITH TRANSVAGINAL
1 series · 14 of 25 positions shown · non-contrast
Comparison: None

CLINICAL DATA: Acute left lower quadrant abdominal pain.

EXAM:
TRANSABDOMINAL AND TRANSVAGINAL ULTRASOUND OF PELVIS
TECHNIQUE: Both transabdominal and transvaginal ultrasound examinations of the
pelvis were performed. Transabdominal technique was performed for
global imaging of the pelvis including uterus, ovaries, adnexal
regions, and pelvic cul-de-sac. It was necessary to proceed with
endovaginal exam following the transabdominal exam to visualize the
endometrium and ovaries.

[Series 1: us pelvic complete with transvaginal · 14 of 92 slices shown]
[im 1/92]
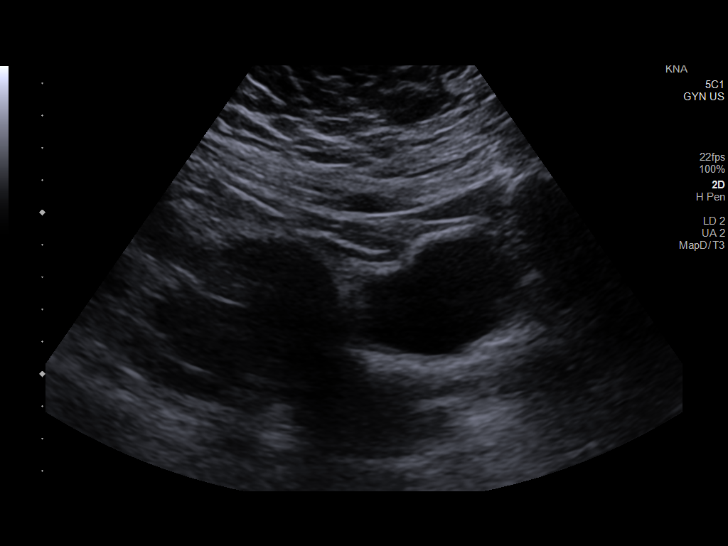
[im 8/92]
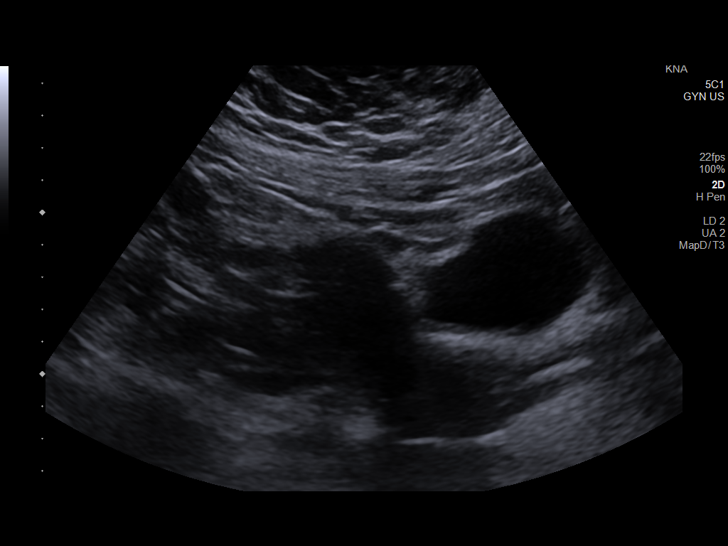
[im 16/92]
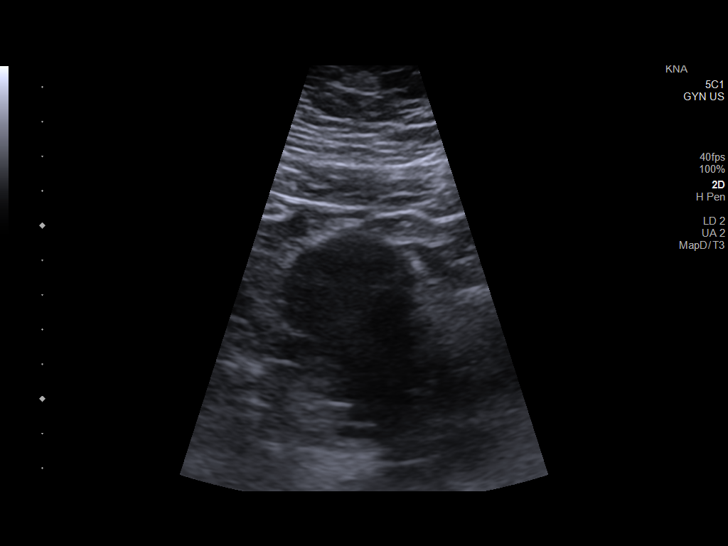
[im 23/92]
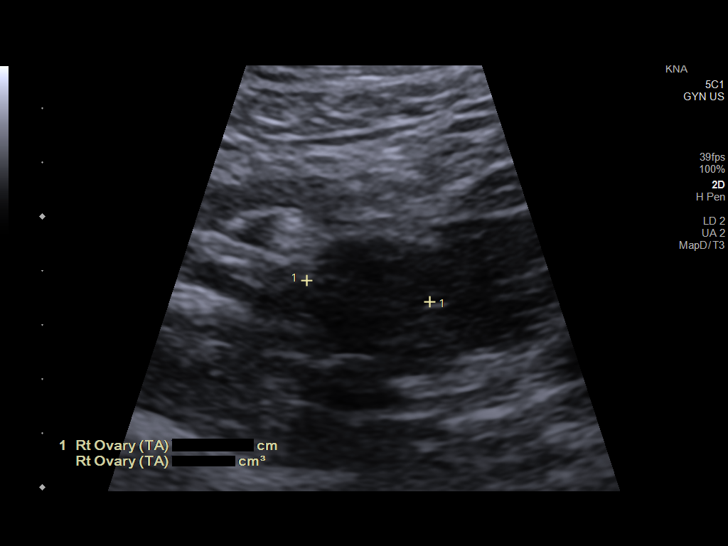
[im 31/92]
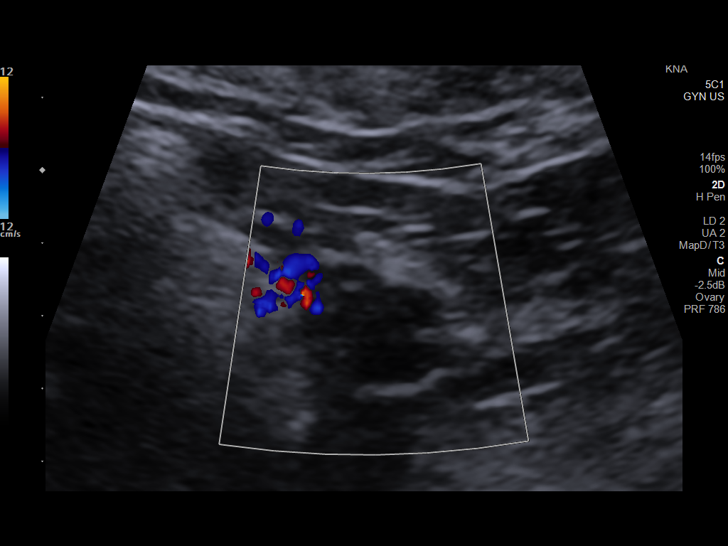
[im 35/92]
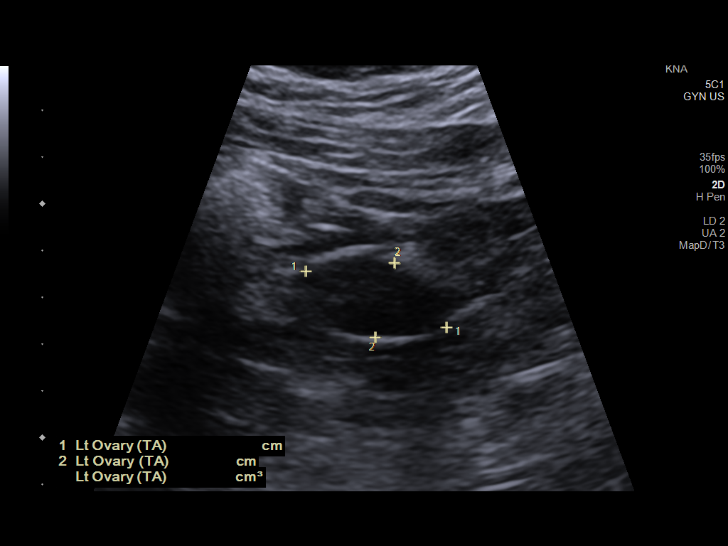
[im 42/92]
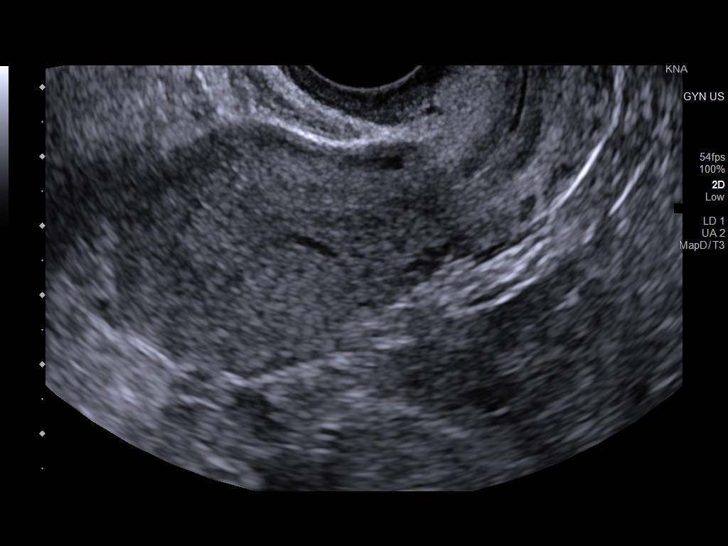
[im 50/92]
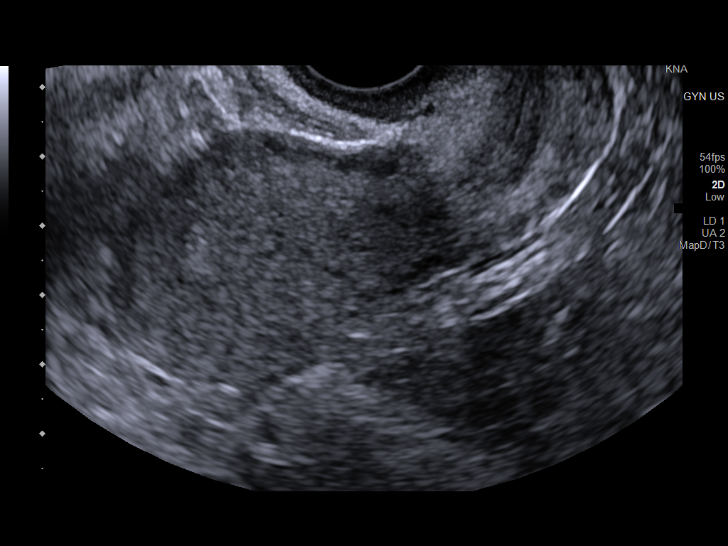
[im 57/92]
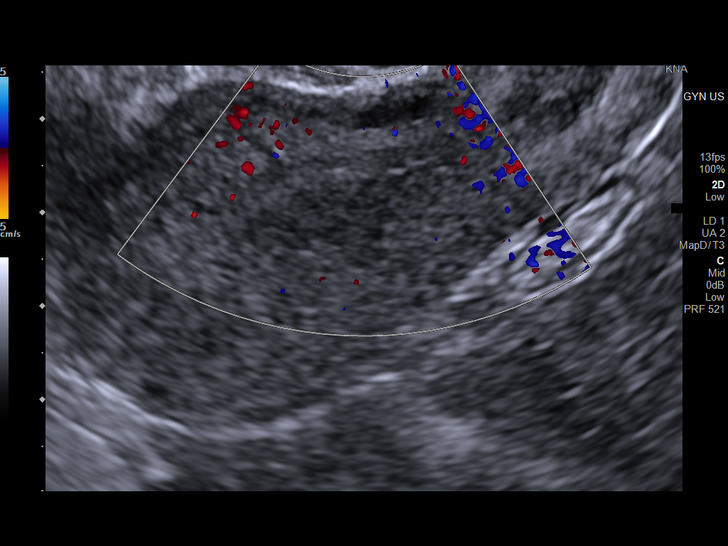
[im 61/92]
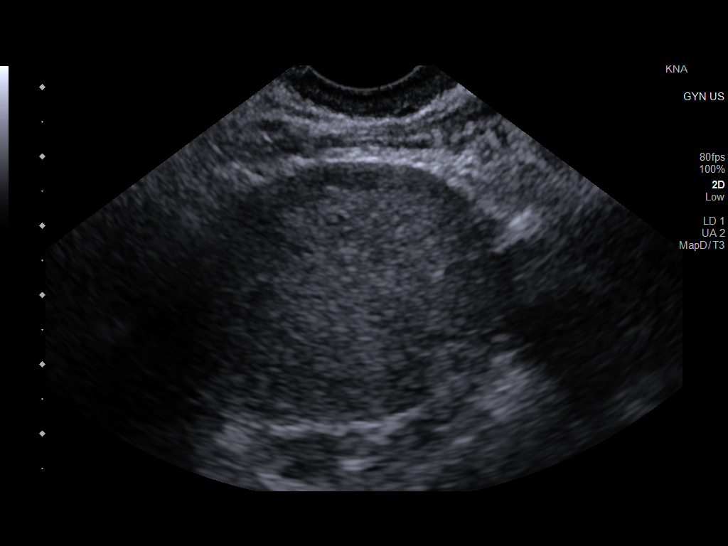
[im 69/92]
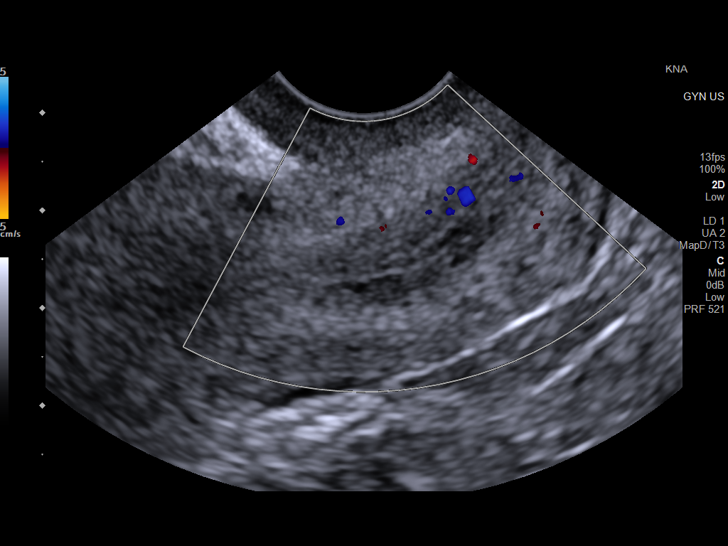
[im 76/92]
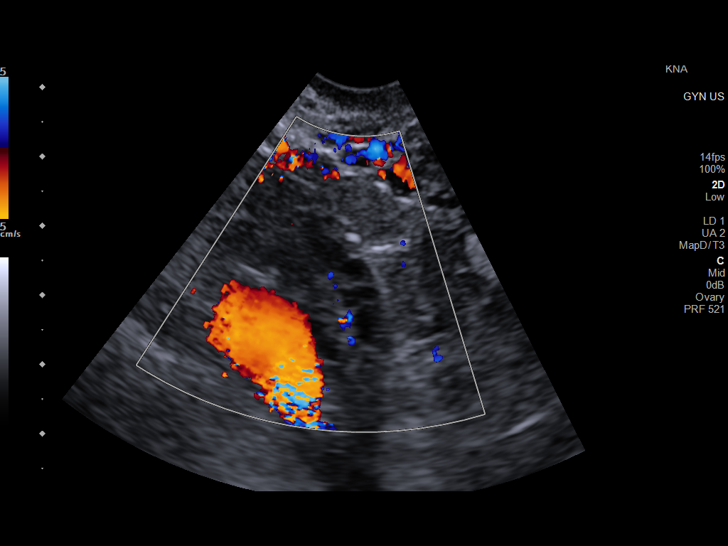
[im 84/92]
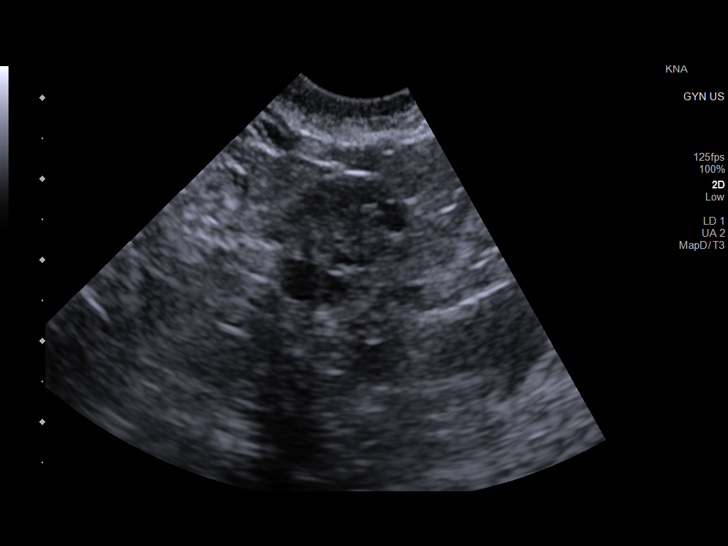
[im 92/92]
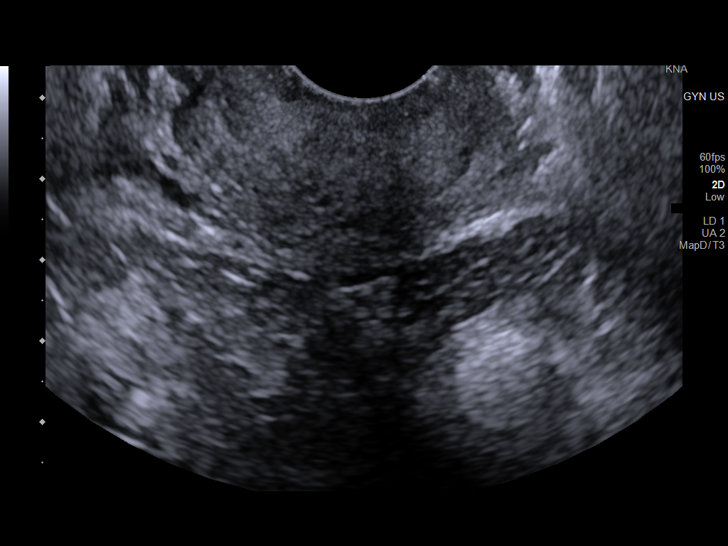

[14 of 25 positions shown; findings below may reference images not displayed]

FINDINGS: Uterus

Measurements: 7.7 x 5.3 x 3.4 cm = volume: 74 mL. No fibroids or
other mass visualized.

Endometrium

Thickness: 9 mm which is within normal limits. No focal abnormality
visualized.

Right ovary

Measurements: 2.9 x 1.5 x 1.4 cm = volume: 3 mL. Normal
appearance/no adnexal mass.

Left ovary

Measurements: 2.8 x 1.9 x 1.7 cm = volume: 5 mL. Normal
appearance/no adnexal mass.

Other findings

No abnormal free fluid.
IMPRESSION: No definite abnormality is noted in the pelvis.

## 2022-12-21 ENCOUNTER — Ambulatory Visit: Payer: Medicaid Other | Admitting: Nurse Practitioner

## 2023-06-20 ENCOUNTER — Ambulatory Visit (INDEPENDENT_AMBULATORY_CARE_PROVIDER_SITE_OTHER): Payer: No Typology Code available for payment source | Admitting: Nurse Practitioner

## 2023-06-20 VITALS — BP 118/89 | HR 98 | Temp 97.5°F | Ht 65.0 in | Wt 272.4 lb

## 2023-06-20 DIAGNOSIS — Z13220 Encounter for screening for lipoid disorders: Secondary | ICD-10-CM

## 2023-06-20 DIAGNOSIS — Z13228 Encounter for screening for other metabolic disorders: Secondary | ICD-10-CM

## 2023-06-20 DIAGNOSIS — Z30013 Encounter for initial prescription of injectable contraceptive: Secondary | ICD-10-CM | POA: Diagnosis not present

## 2023-06-20 DIAGNOSIS — B078 Other viral warts: Secondary | ICD-10-CM | POA: Diagnosis not present

## 2023-06-20 DIAGNOSIS — Z130 Encounter for screening for diseases of the blood and blood-forming organs and certain disorders involving the immune mechanism: Secondary | ICD-10-CM

## 2023-06-20 DIAGNOSIS — Z1329 Encounter for screening for other suspected endocrine disorder: Secondary | ICD-10-CM

## 2023-06-20 LAB — POCT URINE PREGNANCY: Preg Test, Ur: NEGATIVE

## 2023-06-20 MED ORDER — MEDROXYPROGESTERONE ACETATE 150 MG/ML IM SUSP
150.0000 mg | Freq: Once | INTRAMUSCULAR | 0 refills | Status: DC
Start: 2023-06-20 — End: 2023-06-20

## 2023-06-20 MED ORDER — MEDROXYPROGESTERONE ACETATE 150 MG/ML IM SUSY
PREFILLED_SYRINGE | INTRAMUSCULAR | Status: DC
Start: 2023-06-20 — End: 2023-06-20

## 2023-06-20 MED ORDER — MEDROXYPROGESTERONE ACETATE 150 MG/ML IM SUSP
150.0000 mg | Freq: Once | INTRAMUSCULAR | Status: AC
Start: 2023-06-20 — End: 2023-06-20
  Administered 2023-06-20: 150 mg via INTRAMUSCULAR

## 2023-06-20 NOTE — Progress Notes (Signed)
Acute Office Visit  Subjective:     Patient ID: Crystal Holloway, female    DOB: 16-Nov-2000, 22 y.o.   MRN: [id number]    HPI Patient is in today for a request to resume her Depo-provera injections. She was previously receiving Depo-provera injections and switched to NuvaRing. She states it is not comfortable and she stopped using this method two weeks prior. Denies any past issues when taking Depo-Provera. She is not currently sexually active and has not been within the past 12 months. Reports irregular periods, menstruating approximately once every two months. Her periods last for 3 days and are light, with moderate cramping. LMP was in August, pt is unsure of the date. She currently vapes every day, denies alcohol or drug use.   Pt also has a common wart noted on her right index finger. She would like a referral to dermatology. Has tried multiple OTC products with some temporary improvement.   Review of Systems  Constitutional:  Negative for chills and fever.  Respiratory:  Negative for shortness of breath and wheezing.   Cardiovascular:  Negative for chest pain and palpitations.  Gastrointestinal:  Negative for abdominal pain, constipation, diarrhea, heartburn, nausea and vomiting.  Genitourinary:  Negative for dysuria, frequency, hematuria and urgency.        Objective:    BP 118/89   Pulse 98   Temp (!) 97.5 F (36.4 C)   Ht 5\' 5"  (1.651 m)   Wt 123.6 kg   BMI 45.33 kg/m    Physical Exam Vitals and nursing note reviewed.  Constitutional:      Appearance: Normal appearance.  Cardiovascular:     Rate and Rhythm: Normal rate and regular rhythm.     Heart sounds: Normal heart sounds. No murmur heard. Pulmonary:     Effort: Pulmonary effort is normal.     Breath sounds: Normal breath sounds.  Skin:    General: Skin is warm and dry.     Comments: Common wart right index finger close to DIP, no nail or nailbed involvement.   Neurological:     Mental Status: She is  alert.  Psychiatric:        Mood and Affect: Mood normal.        Behavior: Behavior normal.        Thought Content: Thought content normal.        Judgment: Judgment normal.     Results for orders placed or performed in visit on 06/20/23  POCT urine pregnancy  Result Value Ref Range   Preg Test, Ur Negative Negative        Assessment & Plan:   Problem List Items Addressed This Visit   None Visit Diagnoses     Encounter for initial prescription of injectable contraceptive    -  Primary   Relevant Medications   medroxyPROGESTERone (DEPO-PROVERA) injection 150 mg (Completed)   Other Relevant Orders   POCT urine pregnancy (Completed)   Screening for deficiency anemia       Relevant Orders   CBC with Differential/Platelet (Completed)   Screening for metabolic disorder       Relevant Orders   Comprehensive metabolic panel (Completed)   Screening for lipid disorders       Relevant Orders   Lipid panel (Completed)   Screening for thyroid disorder       Relevant Orders   TSH (Completed)   Common wart       Relevant Orders   Ambulatory referral to Dermatology  Will begin Depo-Provera injections. Pt deferred STI screening. Call back if any heavy or prolonged bleeding. Discussed safe sex issues.  Educated on importance of cervical cancer screenings now that she is of age. Recommended scheduling an appointment for a physical and pap smear, ordered lab work to be completed prior to this appointment.  Referred to dermatology for wart treatment.     [place] E Travian Kerner, RN

## 2023-06-21 LAB — CBC WITH DIFFERENTIAL/PLATELET
Basophils Absolute: 0 10*3/uL (ref 0.0–0.2)
Basos: 0 %
EOS (ABSOLUTE): 0.1 10*3/uL (ref 0.0–0.4)
Eos: 1 %
Hematocrit: 41.3 % (ref 34.0–46.6)
Hemoglobin: 13.6 g/dL (ref 11.1–15.9)
Immature Grans (Abs): 0.1 10*3/uL (ref 0.0–0.1)
Immature Granulocytes: 1 %
Lymphocytes Absolute: 2.7 10*3/uL (ref 0.7–3.1)
Lymphs: 29 %
MCH: 30.5 pg (ref 26.6–33.0)
MCHC: 32.9 g/dL (ref 31.5–35.7)
MCV: 93 fL (ref 79–97)
Monocytes Absolute: 0.5 10*3/uL (ref 0.1–0.9)
Monocytes: 6 %
Neutrophils Absolute: 5.9 10*3/uL (ref 1.4–7.0)
Neutrophils: 63 %
Platelets: 406 10*3/uL (ref 150–450)
RBC: 4.46 x10E6/uL (ref 3.77–5.28)
RDW: 11.9 % (ref 11.7–15.4)
WBC: 9.3 10*3/uL (ref 3.4–10.8)

## 2023-06-21 LAB — COMPREHENSIVE METABOLIC PANEL
ALT: 18 [IU]/L (ref 0–32)
AST: 12 [IU]/L (ref 0–40)
Albumin: 4 g/dL (ref 4.0–5.0)
Alkaline Phosphatase: 93 [IU]/L (ref 44–121)
BUN/Creatinine Ratio: 16 (ref 9–23)
BUN: 9 mg/dL (ref 6–20)
Bilirubin Total: 0.2 mg/dL (ref 0.0–1.2)
CO2: 18 mmol/L — ABNORMAL LOW (ref 20–29)
Calcium: 9.2 mg/dL (ref 8.7–10.2)
Chloride: 105 mmol/L (ref 96–106)
Creatinine, Ser: 0.58 mg/dL (ref 0.57–1.00)
Globulin, Total: 2.2 g/dL (ref 1.5–4.5)
Glucose: 107 mg/dL — ABNORMAL HIGH (ref 70–99)
Potassium: 4.1 mmol/L (ref 3.5–5.2)
Sodium: 138 mmol/L (ref 134–144)
Total Protein: 6.2 g/dL (ref 6.0–8.5)
eGFR: 132 mL/min/{1.73_m2} (ref >59)

## 2023-06-21 LAB — LIPID PANEL
Chol/HDL Ratio: 3.7 {ratio} (ref 0.0–4.4)
Cholesterol, Total: 161 mg/dL (ref 100–199)
HDL: 44 mg/dL (ref >39)
LDL Chol Calc (NIH): 95 mg/dL (ref 0–99)
Triglycerides: 121 mg/dL (ref 0–149)
VLDL Cholesterol Cal: 22 mg/dL (ref 5–40)

## 2023-06-21 LAB — TSH: TSH: 2.86 u[IU]/mL (ref 0.450–4.500)

## 2023-06-22 ENCOUNTER — Encounter: Payer: Self-pay | Admitting: Nurse Practitioner

## 2023-06-22 DIAGNOSIS — R7303 Prediabetes: Secondary | ICD-10-CM | POA: Insufficient documentation

## 2023-06-22 NOTE — Progress Notes (Signed)
   Subjective:    Patient ID: Crystal Holloway, female    DOB: 2001/08/08, 22 y.o.   MRN: [id number]  HPI    Review of Systems     Objective:   Physical Exam        Assessment & Plan:

## 2023-07-17 ENCOUNTER — Encounter: Payer: Self-pay | Admitting: Nurse Practitioner

## 2023-07-17 ENCOUNTER — Other Ambulatory Visit: Payer: Self-pay | Admitting: Nurse Practitioner

## 2023-07-17 MED ORDER — NICOTINE 7 MG/24HR TD PT24: 7.0000 mg | MEDICATED_PATCH | Freq: Every day | TRANSDERMAL | 0 refills | Status: DC

## 2023-07-17 MED ORDER — NICOTINE 14 MG/24HR TD PT24: 14.0000 mg | MEDICATED_PATCH | Freq: Every day | TRANSDERMAL | 0 refills | Status: DC

## 2023-09-06 ENCOUNTER — Ambulatory Visit (INDEPENDENT_AMBULATORY_CARE_PROVIDER_SITE_OTHER): Payer: Medicaid Other | Admitting: *Deleted

## 2023-09-06 DIAGNOSIS — Z3042 Encounter for surveillance of injectable contraceptive: Secondary | ICD-10-CM | POA: Diagnosis not present

## 2023-09-06 MED ORDER — MEDROXYPROGESTERONE ACETATE 150 MG/ML IM SUSP
150.0000 mg | Freq: Once | INTRAMUSCULAR | Status: AC
Start: 2023-09-06 — End: 2023-09-06
  Administered 2023-09-06: 150 mg via INTRAMUSCULAR

## 2023-09-16 ENCOUNTER — Ambulatory Visit: Payer: No Typology Code available for payment source | Admitting: Nurse Practitioner

## 2023-09-17 ENCOUNTER — Ambulatory Visit: Payer: No Typology Code available for payment source | Admitting: Nurse Practitioner

## 2023-10-11 ENCOUNTER — Ambulatory Visit (INDEPENDENT_AMBULATORY_CARE_PROVIDER_SITE_OTHER): Payer: BC Managed Care – PPO | Admitting: Nurse Practitioner

## 2023-10-11 VITALS — BP 114/77 | HR 84 | Temp 98.7°F | Ht 65.0 in | Wt 282.4 lb

## 2023-10-11 DIAGNOSIS — Z3009 Encounter for other general counseling and advice on contraception: Secondary | ICD-10-CM

## 2023-10-11 DIAGNOSIS — J019 Acute sinusitis, unspecified: Secondary | ICD-10-CM | POA: Diagnosis not present

## 2023-10-11 DIAGNOSIS — F419 Anxiety disorder, unspecified: Secondary | ICD-10-CM

## 2023-10-11 DIAGNOSIS — Z6841 Body Mass Index (BMI) 40.0 and over, adult: Secondary | ICD-10-CM

## 2023-10-11 DIAGNOSIS — B9689 Other specified bacterial agents as the cause of diseases classified elsewhere: Secondary | ICD-10-CM

## 2023-10-11 DIAGNOSIS — F32A Depression, unspecified: Secondary | ICD-10-CM

## 2023-10-11 MED ORDER — FLUOXETINE HCL 20 MG PO CAPS: 20.0000 mg | ORAL_CAPSULE | Freq: Every day | ORAL | 0 refills | Status: DC

## 2023-10-11 MED ORDER — FLUTICASONE PROPIONATE 50 MCG/ACT NA SUSP: 2.0000 | Freq: Every day | NASAL | 6 refills | Status: DC

## 2023-10-11 MED ORDER — DOXYCYCLINE HYCLATE 100 MG PO CAPS: 100.0000 mg | ORAL_CAPSULE | Freq: Two times a day (BID) | ORAL | 0 refills | Status: DC

## 2023-10-11 NOTE — Progress Notes (Unsigned)
Subjective:    Patient ID: Crystal Holloway, female    DOB: 2001-08-25, 23 y.o.   MRN: [id number]  HPI Crystal Holloway presents today to discuss birth control and weight loss options. She was on the Depo-Provera, and gained 100 lbs while on the medication Also has tried pills and Nuvaring. Does not want to try IUD or Nexplanon. Reports that she is only sexually active once a month, and they use condoms consistently. Reports that she would be okay getting off birth control completely. 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 She does snack a lot, instead of eating full meals. No exercise outside of her job. No menses while on Depo Provera.  Crystal Holloway also complains of ear pain and sinus congestion for a week now. She was prescribed Amoxicillin and finished the dose two days ago with no relief. Ear pain is mainly in the left, but does have ear congestion in both ears bilaterally. Has sinus pressure, and a sinus headache. Reports green drainage from nose for a week. Has tried Dayquil and Nyquil.   Struggling with anxiety and depression lately. Would like to restart SSRI she has taken in the past without difficulty. A review of the chart indicates she was on Prozac 20 mg in the past.    Review of Systems  Constitutional:  Positive for fatigue. Negative for chills and fever.  HENT:  Positive for congestion, ear pain, rhinorrhea, sinus pressure and sinus pain. Negative for sneezing, sore throat and trouble swallowing.   Respiratory:  Negative for cough, chest tightness, shortness of breath and wheezing.   Cardiovascular:  Negative for chest pain and palpitations.  Psychiatric/Behavioral:  Positive for sleep disturbance. Negative for self-injury and suicidal ideas.       Objective:   Physical Exam Vitals and nursing note reviewed.  Constitutional:      General: She is not in acute distress.    Appearance: She is obese. She is not ill-appearing.  HENT:     Head:      Comments: Positive for ethmoid sinus tenderness with percussion.    Right Ear: Ear canal and external ear normal. No decreased hearing noted. A middle ear effusion is present. Tympanic membrane is bulging. Tympanic membrane is not injected.     Left Ear: Ear canal and external ear normal. No decreased hearing noted.  No middle ear effusion. Tympanic membrane is injected. Tympanic membrane is not bulging.     Mouth/Throat:     Mouth: Mucous membranes are moist.     Pharynx: Postnasal drip present.  Cardiovascular:     Heart sounds: S1 normal and S2 normal. No murmur heard. Lymphadenopathy:     Cervical: No cervical adenopathy.  Neurological:     Mental Status: She is alert.    Vitals:   10/11/23 0956  BP: 114/77  Pulse: 84  Temp: 98.7 F (37.1 C)  Height: 5\' 5"  (1.651 m)  Weight: 282 lb 6.4 oz (128.1 kg)  SpO2: 99%  BMI (Calculated): 46.99       10/11/2023   10:06 AM 06/20/2023   10:09 AM 10/30/2022   10:04 AM 10/12/2022   11:32 AM 09/18/2022   10:24 AM  Depression screen PHQ 2/9  Decreased Interest 0 1 1 0 1  Down, Depressed, Hopeless 1 0 1 1 1   PHQ - 2 Score 1 1 2 1 2   Altered sleeping 2 1 1 1 2   Tired, decreased energy 2 1 0 1 1  Change in appetite 2 1 0 1 2  Feeling bad or failure about yourself  0 0 0 0 0  Trouble concentrating 2 0 1 0 0  Moving slowly or fidgety/restless 0 0 0 1 0  Suicidal thoughts 0 0 0 0 0  PHQ-9 Score 9 4 4 5 7   Difficult doing work/chores Very difficult Somewhat difficult Somewhat difficult Somewhat difficult Not difficult at all       10/11/2023   10:06 AM 06/20/2023   10:09 AM 10/30/2022   10:05 AM 10/12/2022   11:32 AM  GAD 7 : Generalized Anxiety Score  Nervous, Anxious, on Edge 1 0 1 2  Control/stop worrying 0 0 0 0  Worry too much - different things 0 0 1 1  Trouble relaxing 1 0 1 1  Restless 1 0 1 2  Easily annoyed or irritable 1 0 1 2  Afraid - awful might happen 0 0 0 0  Total GAD 7 Score 4 0 5 8  Anxiety Difficulty Very difficult  Not difficult at all Somewhat difficult Somewhat difficult      Assessment & Plan:  1. General counseling and advice for contraceptive management (Primary) -Educated patient about safe sex practices. Continue use of condoms. Call back if any heavy or prolonged menstrual bleeding off Depo Provera.   2. Acute bacterial rhinosinusitis -Start antibiotic and steroid nasal spray. Call back if persists.   3. Morbid obesity with BMI of 45.0-49.9, adult (HCC) -Advised patient to check with insurance about weight loss medications they will cover including GLP-1 and send [place] a message.  4. Anxiety and depression -Educated patient about potential adverse effects including box warnings while taking Prozac. DC med and contact office if any problems.   Meds ordered this encounter  Medications   FLUoxetine (PROZAC) 20 MG capsule    Sig: Take 1 capsule (20 mg total) by mouth daily.    Dispense:  90 capsule    Refill:  0    Supervising Provider:   Lilyan Punt A [[id number]]   doxycycline (VIBRAMYCIN) 100 MG capsule    Sig: Take 1 capsule (100 mg total) by mouth 2 (two) times daily.    Dispense:  14 capsule    Refill:  0    Supervising Provider:   Lilyan Punt A [[id number]]   fluticasone (FLONASE) 50 MCG/ACT nasal spray    Sig: Place 2 sprays into both nostrils daily.    Dispense:  16 g    Refill:  6    Supervising Provider:   Lilyan Punt A [[id number]]    Return in about 3 months (around 01/08/2024).   I have seen and examined this patient alongside the NP student. I have reviewed and verified the student note and agree with the assessment and plan.  Sherie Don, FNP

## 2023-10-11 NOTE — Patient Instructions (Signed)
Wegovy and Zepbound 

## 2023-10-13 ENCOUNTER — Encounter: Payer: Self-pay | Admitting: Nurse Practitioner

## 2023-12-30 ENCOUNTER — Encounter: Payer: Self-pay | Admitting: Nurse Practitioner

## 2024-01-07 ENCOUNTER — Ambulatory Visit: Payer: BC Managed Care – PPO | Admitting: Nurse Practitioner

## 2024-01-07 ENCOUNTER — Other Ambulatory Visit: Payer: Self-pay | Admitting: Nurse Practitioner

## 2024-04-30 ENCOUNTER — Emergency Department (HOSPITAL_COMMUNITY): Payer: PRIVATE HEALTH INSURANCE

## 2024-04-30 ENCOUNTER — Other Ambulatory Visit: Payer: Self-pay

## 2024-04-30 ENCOUNTER — Emergency Department (HOSPITAL_COMMUNITY)
Admission: EM | Admit: 2024-04-30 | Discharge: 2024-04-30 | Disposition: A | Discharge status: Home / Self Care | Payer: PRIVATE HEALTH INSURANCE | Attending: Emergency Medicine | Admitting: Emergency Medicine

## 2024-04-30 DIAGNOSIS — M545 Low back pain, unspecified: Secondary | ICD-10-CM | POA: Diagnosis present

## 2024-04-30 DIAGNOSIS — M62830 Muscle spasm of back: Secondary | ICD-10-CM

## 2024-04-30 LAB — CBC WITH DIFFERENTIAL/PLATELET
Abs Immature Granulocytes: 0.03 K/uL (ref 0.00–0.07)
Basophils Absolute: 0 K/uL (ref 0.0–0.1)
Basophils Relative: 1 %
Eosinophils Absolute: 0.1 K/uL (ref 0.0–0.5)
Eosinophils Relative: 1 %
HCT: 39.9 % (ref 36.0–46.0)
Hemoglobin: 13.7 g/dL (ref 12.0–15.0)
Immature Granulocytes: 0 %
Lymphocytes Relative: 28 %
Lymphs Abs: 2.4 K/uL (ref 0.7–4.0)
MCH: 30.4 pg (ref 26.0–34.0)
MCHC: 34.3 g/dL (ref 30.0–36.0)
MCV: 88.5 fL (ref 80.0–100.0)
Monocytes Absolute: 0.7 K/uL (ref 0.1–1.0)
Monocytes Relative: 8 %
Neutro Abs: 5.3 K/uL (ref 1.7–7.7)
Neutrophils Relative %: 62 %
Platelets: 389 K/uL (ref 150–400)
RBC: 4.51 MIL/uL (ref 3.87–5.11)
RDW: 12.3 % (ref 11.5–15.5)
WBC: 8.6 K/uL (ref 4.0–10.5)
nRBC: 0 % (ref 0.0–0.2)

## 2024-04-30 LAB — COMPREHENSIVE METABOLIC PANEL WITH GFR
ALT: 40 U/L (ref 0–44)
AST: 27 U/L (ref 15–41)
Albumin: 3.9 g/dL (ref 3.5–5.0)
Alkaline Phosphatase: 80 U/L (ref 38–126)
Anion gap: 12 (ref 5–15)
BUN: 11 mg/dL (ref 6–20)
CO2: 23 mmol/L (ref 22–32)
Calcium: 8.8 mg/dL — ABNORMAL LOW (ref 8.9–10.3)
Chloride: 104 mmol/L (ref 98–111)
Creatinine, Ser: 0.66 mg/dL (ref 0.44–1.00)
GFR, Estimated: >60 mL/min (ref >60)
Glucose, Bld: 96 mg/dL (ref 70–99)
Potassium: 3.7 mmol/L (ref 3.5–5.1)
Sodium: 139 mmol/L (ref 135–145)
Total Bilirubin: 0.7 mg/dL (ref 0.0–1.2)
Total Protein: 6.8 g/dL (ref 6.5–8.1)

## 2024-04-30 LAB — HCG, SERUM, QUALITATIVE: Preg, Serum: NEGATIVE

## 2024-04-30 LAB — URINALYSIS, ROUTINE W REFLEX MICROSCOPIC
Bilirubin Urine: NEGATIVE
Glucose, UA: NEGATIVE mg/dL
Ketones, ur: NEGATIVE mg/dL
Leukocytes,Ua: NEGATIVE
Nitrite: NEGATIVE
Protein, ur: 30 mg/dL — AB
Specific Gravity, Urine: 1.027 (ref 1.005–1.030)
pH: 6 (ref 5.0–8.0)

## 2024-04-30 MED ORDER — DEXAMETHASONE SODIUM PHOSPHATE 10 MG/ML IJ SOLN
10.0000 mg | Freq: Once | INTRAMUSCULAR | Status: AC
  Administered 2024-04-30: 10 mg via INTRAVENOUS
  Filled 2024-04-30: qty 1

## 2024-04-30 MED ORDER — METHOCARBAMOL 500 MG PO TABS
750.0000 mg | ORAL_TABLET | Freq: Once | ORAL | Status: AC
  Administered 2024-04-30: 750 mg via ORAL
  Filled 2024-04-30: qty 2

## 2024-04-30 MED ORDER — METHOCARBAMOL 750 MG PO TABS
750.0000 mg | ORAL_TABLET | Freq: Three times a day (TID) | ORAL | 0 refills | Status: AC
Start: 2024-04-30 — End: ?

## 2024-04-30 MED ORDER — PREDNISONE 10 MG PO TABS: 40.0000 mg | ORAL_TABLET | Freq: Every day | ORAL | 0 refills | Status: AC

## 2024-04-30 NOTE — Discharge Instructions (Signed)
 Please follow-up closely with a primary care doctor on an outpatient basis.  Return to emergency department immediately for any new or worsening symptoms.  [facility] Primary Care Doctor List    Syliva Overman, MD. Specialty: [facility] Medicine Contact information: [address]  [place] [address]  [phone]   Lilyan Punt, MD. Specialty: [facility] Medicine Contact information: [address]  [place] [address]  [phone]   Avon Gully, MD Specialty: Internal Medicine Contact information: [address] [place] [address]  [phone]   Catalina Pizza, MD. Specialty: Internal Medicine Contact information: [address]  [place] [address]  [phone]    [facility] (Dr. Selena Batten) Specialty: Family Medicine Contact information: [address]  [place] [address]  [phone]   John Giovanni, MD. Specialty: [facility] Medicine Contact information: [address]  [address]  [place] [address]  [phone]   Carylon Perches, MD. Specialty: Internal Medicine Contact information: [address]  [address]  [place] [address]  [phone]   [facility] Family Medicine: [address] [phone]  [facility], Family medicine [address]  [phone]  [facility] [address] [place], [place] [phone]  [facility]: [address] [phone]    [facility] - Benita Stabile  [address] [place], [place] [address] [phone]  Services [facility] offers a variety of basic health services.  Services include but are not limited to: Blood pressure checks  Heart rate checks  Blood sugar checks  Urine analysis  Rapid strep tests  Pregnancy tests.  Health education and referrals  People needing more complex services will be directed to a physician online. Using these virtual visits, doctors can evaluate  and prescribe medicine and treatments. There will be no medication on-site, though [facility] will help patients fill their prescriptions at little to no cost.   For More information please go to: [email or web address]  Allergy and Asthma:    [address] [place] [phone]  Urology:  [address]  [place] [phone]  [facility]  [address] [place], [place] [phone]  Orthopedics   [address] [place], [place] [phone]  Endocrinology  [address] [place], [place] [phone]  Podiatry: [facility] [phone]

## 2024-04-30 NOTE — ED Provider Notes (Signed)
 [place] EMERGENCY DEPARTMENT AT [facility] Provider Note   CSN: [id number] Arrival date & time: 04/30/24  9068     Patient presents with: Back Pain   Crystal Holloway is a 23 y.o. female.   Patient is a 23 year old female who presents to the emergency department the chief complaint of lower back pain with radiation down bilateral lower extremities.  She notes that the pain has been ongoing and intermittent for the past few months but did become worse today.  Patient denies any recent falls or blunt trauma to her back.  She denies any urinary bowel incontinence, saddle paresthesias, gait changes, fever, chills, history of IV drug use, history of HIV, history of cancer, history of chronic steroid use.  She denies any associated abdominal pain, nausea, vomiting, diarrhea.  She has had no dysuria or hematuria.  She notes that she has taken Tylenol for her pain.   Back Pain      Prior to Admission medications   Medication Sig Start Date End Date Taking? Authorizing Provider  doxycycline  (VIBRAMYCIN ) 100 MG capsule Take 1 capsule (100 mg total) by mouth 2 (two) times daily. 10/11/23   Mauro Elveria BROCKS, NP  FLUoxetine  (PROZAC ) 20 MG capsule Take 1 capsule by mouth once daily 01/07/24   Hoskins, Carolyn C, NP  fluticasone  (FLONASE ) 50 MCG/ACT nasal spray Place 2 sprays into both nostrils daily. 10/11/23   Mauro Elveria BROCKS, NP    Allergies: Ibuprofen     Review of Systems  Musculoskeletal:  Positive for back pain.  All other systems reviewed and are negative.   Updated Vital Signs BP 123/80 (BP Location: Left Arm)   Pulse 88   Temp 97.9 F (36.6 C) (Oral)   Resp 18   SpO2 99%   Physical Exam Vitals and nursing note reviewed.  Constitutional:      Appearance: Normal appearance.  HENT:     Head: Normocephalic and atraumatic.     Nose: Nose normal.     Mouth/Throat:     Mouth: Mucous membranes are moist.  Eyes:     Extraocular Movements: Extraocular movements  intact.     Conjunctiva/sclera: Conjunctivae normal.     Pupils: Pupils are equal, round, and reactive to light.  Cardiovascular:     Rate and Rhythm: Normal rate and regular rhythm.     Pulses: Normal pulses.     Heart sounds: Normal heart sounds. No murmur heard.    No gallop.  Pulmonary:     Effort: Pulmonary effort is normal. No respiratory distress.     Breath sounds: Normal breath sounds. No stridor. No wheezing, rhonchi or rales.  Abdominal:     General: Abdomen is flat. Bowel sounds are normal. There is no distension.     Palpations: Abdomen is soft.     Tenderness: There is no abdominal tenderness. There is no guarding.  Musculoskeletal:        General: Normal range of motion.     Cervical back: Normal range of motion and neck supple. No rigidity or tenderness.     Comments: Tender to palpation over lumbar spine diffusely and paraspinous muscles, nontender to palpation of the thoracic spine, no step-off or deformity, no CVA tenderness, positive bilateral straight leg raise  Skin:    General: Skin is warm and dry.  Neurological:     General: No focal deficit present.     Mental Status: She is alert and oriented to person, place, and time. Mental status is at  baseline.     Cranial Nerves: No cranial nerve deficit.     Sensory: No sensory deficit.     Motor: No weakness.     Coordination: Coordination normal.     Gait: Gait normal.     Deep Tendon Reflexes: Reflexes normal.     Comments: Extension of bilateral great toes intact, extension and flexion at hips intact  Psychiatric:        Mood and Affect: Mood normal.        Behavior: Behavior normal.        Thought Content: Thought content normal.        Judgment: Judgment normal.     (all labs ordered are listed, but only abnormal results are displayed) Labs Reviewed  COMPREHENSIVE METABOLIC PANEL WITH GFR  CBC WITH DIFFERENTIAL/PLATELET  HCG, SERUM, QUALITATIVE  URINALYSIS, ROUTINE W REFLEX MICROSCOPIC     EKG: None  Radiology: No results found.   Procedures   Medications Ordered in the ED  dexamethasone  (DECADRON ) injection 10 mg (has no administration in time range)  methocarbamol  (ROBAXIN ) tablet 750 mg (has no administration in time range)                                    Medical Decision Making Amount and/or Complexity of Data Reviewed Labs: ordered. Radiology: ordered.  Risk Prescription drug management.   This patient presents to the ED for concern of back pain differential diagnosis includes musculoskeletal pain, pyelonephritis, kidney stone, cauda equina syndrome, vertebral osteomyelitis, epidural abscess    Additional history obtained:  Additional history obtained from none External records from outside source obtained and reviewed including none   Lab Tests:  I Ordered, and personally interpreted labs.  The pertinent results include: No leukocytosis, no anemia, normal kidney function liver function, normal electrolytes, unremarkable urinalysis   Imaging Studies ordered:  I ordered imaging studies including CT scan of the abdomen and pelvis, MRI lumbar spine I independently visualized and interpreted imaging which showed no acute intra-abdominal surgical process, no kidney stone, unremarkable MRI of lumbar spine I agree with the radiologist interpretation   Medicines ordered and prescription drug management:  I ordered medication including Robaxin , Decadron  for back pain Reevaluation of the patient after these medicines showed that the patient improved I have reviewed the patients home medicines and have made adjustments as needed   Problem List / ED Course:  Patient is doing much better at this time and is stable for discharge home.  Discussed with patient that all workup and imaging has been unremarkable.  Patient is TUNAFISH negative and has no concern neurological deficits at this point.  MRI was obtained as patient was experiencing  bilateral radiculopathy.  Do not specked any further advanced imaging is warranted at this time.  She has no clinical indication or imaging findings to suggest underlying etiology such as cauda equina syndrome, vertebral mellitus, epidural abscess.  Blood work and urinalysis have been unremarkable.  She is currently on her menstrual cycle at this point.  The need for close follow-up with her primary care doctor was discussed as well as strict turn precautions for any new or worsening symptoms.  Patient voiced understanding and had no additional questions.   Social Determinants of Health:  None        Final diagnoses:  None    ED Discharge Orders     None  Daralene Lonni BIRCH, PA-C 04/30/24 1408    Dean Clarity, MD 04/30/24 1504

## 2024-04-30 NOTE — ED Notes (Signed)
Pt aware we need urine sample.  
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
## 2024-06-23 ENCOUNTER — Emergency Department (HOSPITAL_COMMUNITY)
Admission: EM | Admit: 2024-06-23 | Discharge: 2024-06-24 | Disposition: A | Discharge status: Home / Self Care | Payer: Worker's Compensation | Attending: Emergency Medicine | Admitting: Emergency Medicine

## 2024-06-23 ENCOUNTER — Other Ambulatory Visit: Payer: Self-pay

## 2024-06-23 ENCOUNTER — Encounter (HOSPITAL_COMMUNITY): Payer: Self-pay | Admitting: Emergency Medicine

## 2024-06-23 DIAGNOSIS — S0990XA Unspecified injury of head, initial encounter: Secondary | ICD-10-CM | POA: Insufficient documentation

## 2024-06-23 DIAGNOSIS — S0993XA Unspecified injury of face, initial encounter: Secondary | ICD-10-CM | POA: Insufficient documentation

## 2024-06-23 DIAGNOSIS — Y990 Civilian activity done for income or pay: Secondary | ICD-10-CM | POA: Insufficient documentation

## 2024-06-23 DIAGNOSIS — W228XXA Striking against or struck by other objects, initial encounter: Secondary | ICD-10-CM | POA: Insufficient documentation

## 2024-06-23 DIAGNOSIS — S0083XA Contusion of other part of head, initial encounter: Secondary | ICD-10-CM

## 2024-06-23 HISTORY — DX: Gout, unspecified: M10.9
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
## 2024-06-24 ENCOUNTER — Emergency Department (HOSPITAL_COMMUNITY): Payer: Worker's Compensation
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
